# Patient Record
Sex: Male | Born: 1995 | Race: White | Hispanic: No | Marital: Single | Smoking: Former smoker
Health system: Southern US, Community
[De-identification: ages and names within clinical notes are randomized; demographics above are authoritative.]

## PROBLEM LIST (undated history)

## (undated) DIAGNOSIS — F419 Anxiety disorder, unspecified: Secondary | ICD-10-CM

## (undated) DIAGNOSIS — Z973 Presence of spectacles and contact lenses: Secondary | ICD-10-CM

## (undated) DIAGNOSIS — L723 Sebaceous cyst: Secondary | ICD-10-CM

## (undated) DIAGNOSIS — Z8719 Personal history of other diseases of the digestive system: Secondary | ICD-10-CM

## (undated) DIAGNOSIS — W3400XA Accidental discharge from unspecified firearms or gun, initial encounter: Secondary | ICD-10-CM

## (undated) HISTORY — DX: Anxiety disorder, unspecified: F41.9

## (undated) HISTORY — PX: OTHER SURGICAL HISTORY: SHX169

## (undated) HISTORY — DX: Accidental discharge from unspecified firearms or gun, initial encounter: W34.00XA

## (undated) HISTORY — PX: NO PAST SURGERIES: SHX2092

---
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
## 2016-01-03 ENCOUNTER — Ambulatory Visit: Payer: Self-pay | Admitting: General Surgery

## 2016-09-18 ENCOUNTER — Ambulatory Visit: Payer: Self-pay | Admitting: General Surgery

## 2016-09-24 ENCOUNTER — Encounter (HOSPITAL_BASED_OUTPATIENT_CLINIC_OR_DEPARTMENT_OTHER): Payer: Self-pay | Admitting: *Deleted

## 2016-09-24 NOTE — Progress Notes (Signed)
NPO AFTER MN W/ EXCEPTION CLEAR LIQUIDS UNTIL 0700.  ARRIVE AT 1130.  NEEDS HG.

## 2016-09-30 ENCOUNTER — Ambulatory Visit (HOSPITAL_BASED_OUTPATIENT_CLINIC_OR_DEPARTMENT_OTHER): Payer: Managed Care, Other (non HMO) | Admitting: Anesthesiology

## 2016-09-30 ENCOUNTER — Ambulatory Visit (HOSPITAL_BASED_OUTPATIENT_CLINIC_OR_DEPARTMENT_OTHER)
Admission: RE | Admit: 2016-09-30 | Discharge: 2016-09-30 | Disposition: A | Discharge status: Home / Self Care | Payer: Managed Care, Other (non HMO) | Source: Ambulatory Visit | Attending: General Surgery | Admitting: General Surgery

## 2016-09-30 ENCOUNTER — Encounter (HOSPITAL_BASED_OUTPATIENT_CLINIC_OR_DEPARTMENT_OTHER)
Admission: RE | Disposition: A | Discharge status: Home / Self Care | Payer: Self-pay | Source: Ambulatory Visit | Attending: General Surgery

## 2016-09-30 ENCOUNTER — Encounter (HOSPITAL_BASED_OUTPATIENT_CLINIC_OR_DEPARTMENT_OTHER): Payer: Self-pay | Admitting: Anesthesiology

## 2016-09-30 DIAGNOSIS — M25861 Other specified joint disorders, right knee: Secondary | ICD-10-CM | POA: Diagnosis not present

## 2016-09-30 DIAGNOSIS — F419 Anxiety disorder, unspecified: Secondary | ICD-10-CM | POA: Diagnosis not present

## 2016-09-30 DIAGNOSIS — F17210 Nicotine dependence, cigarettes, uncomplicated: Secondary | ICD-10-CM | POA: Insufficient documentation

## 2016-09-30 DIAGNOSIS — D487 Neoplasm of uncertain behavior of other specified sites: Secondary | ICD-10-CM | POA: Diagnosis present

## 2016-09-30 HISTORY — DX: Sebaceous cyst: L72.3

## 2016-09-30 HISTORY — DX: Presence of spectacles and contact lenses: Z97.3

## 2016-09-30 HISTORY — DX: Personal history of other diseases of the digestive system: Z87.19

## 2016-09-30 HISTORY — PX: CYST EXCISION: SHX5701

## 2016-09-30 LAB — POCT HEMOGLOBIN-HEMACUE: Hemoglobin: 15 g/dL (ref 13.0–17.0)

## 2016-09-30 SURGERY — CYST REMOVAL
Anesthesia: General | Site: Knee

## 2016-09-30 MED ORDER — CELECOXIB 200 MG PO CAPS
ORAL_CAPSULE | ORAL | Status: AC
  Filled 2016-09-30: qty 2

## 2016-09-30 MED ORDER — MIDAZOLAM HCL 2 MG/2ML IJ SOLN
INTRAMUSCULAR | Status: AC
  Filled 2016-09-30: qty 2

## 2016-09-30 MED ORDER — MIDAZOLAM HCL 2 MG/2ML IJ SOLN
0.5000 mg | Freq: Once | INTRAMUSCULAR | Status: DC | PRN
  Filled 2016-09-30: qty 2

## 2016-09-30 MED ORDER — LACTATED RINGERS IV SOLN
INTRAVENOUS | Status: DC
  Administered 2016-09-30: 12:00:00 via INTRAVENOUS
  Filled 2016-09-30: qty 1000

## 2016-09-30 MED ORDER — CHLORHEXIDINE GLUCONATE CLOTH 2 % EX PADS
6.0000 | MEDICATED_PAD | Freq: Once | CUTANEOUS | Status: DC
  Filled 2016-09-30: qty 6

## 2016-09-30 MED ORDER — LIDOCAINE HCL 1 % IJ SOLN
INTRAMUSCULAR | Status: AC
  Filled 2016-09-30: qty 20

## 2016-09-30 MED ORDER — GABAPENTIN 300 MG PO CAPS
300.0000 mg | ORAL_CAPSULE | ORAL | Status: AC
  Administered 2016-09-30: 300 mg via ORAL
  Filled 2016-09-30: qty 1

## 2016-09-30 MED ORDER — DEXAMETHASONE SODIUM PHOSPHATE 10 MG/ML IJ SOLN
INTRAMUSCULAR | Status: AC
  Filled 2016-09-30: qty 1

## 2016-09-30 MED ORDER — ONDANSETRON HCL 4 MG/2ML IJ SOLN
INTRAMUSCULAR | Status: DC | PRN
  Administered 2016-09-30: 4 mg via INTRAVENOUS

## 2016-09-30 MED ORDER — MEPERIDINE HCL 25 MG/ML IJ SOLN
6.2500 mg | INTRAMUSCULAR | Status: DC | PRN
  Filled 2016-09-30: qty 1

## 2016-09-30 MED ORDER — ACETAMINOPHEN 500 MG PO TABS
ORAL_TABLET | ORAL | Status: AC
  Filled 2016-09-30: qty 2

## 2016-09-30 MED ORDER — BACITRACIN-NEOMYCIN-POLYMYXIN 400-5-5000 EX OINT
TOPICAL_OINTMENT | CUTANEOUS | Status: AC
  Filled 2016-09-30: qty 1

## 2016-09-30 MED ORDER — KETOROLAC TROMETHAMINE 30 MG/ML IJ SOLN
INTRAMUSCULAR | Status: AC
  Filled 2016-09-30: qty 1

## 2016-09-30 MED ORDER — PROPOFOL 10 MG/ML IV BOLUS
INTRAVENOUS | Status: AC
  Filled 2016-09-30: qty 20

## 2016-09-30 MED ORDER — CELECOXIB 400 MG PO CAPS
400.0000 mg | ORAL_CAPSULE | ORAL | Status: AC
  Administered 2016-09-30: 400 mg via ORAL
  Filled 2016-09-30: qty 1

## 2016-09-30 MED ORDER — BUPIVACAINE HCL 0.5 % IJ SOLN
INTRAMUSCULAR | Status: DC | PRN
  Administered 2016-09-30: 18 mL

## 2016-09-30 MED ORDER — CEFAZOLIN SODIUM-DEXTROSE 2-4 GM/100ML-% IV SOLN
2.0000 g | INTRAVENOUS | Status: AC
  Administered 2016-09-30: 2 g via INTRAVENOUS
  Filled 2016-09-30: qty 100

## 2016-09-30 MED ORDER — MIDAZOLAM HCL 5 MG/5ML IJ SOLN
INTRAMUSCULAR | Status: DC | PRN
  Administered 2016-09-30: 2 mg via INTRAVENOUS

## 2016-09-30 MED ORDER — FENTANYL CITRATE (PF) 100 MCG/2ML IJ SOLN
INTRAMUSCULAR | Status: DC | PRN
  Administered 2016-09-30: 50 ug via INTRAVENOUS

## 2016-09-30 MED ORDER — ACETAMINOPHEN 500 MG PO TABS
1000.0000 mg | ORAL_TABLET | ORAL | Status: AC
  Administered 2016-09-30: 1000 mg via ORAL
  Filled 2016-09-30: qty 2

## 2016-09-30 MED ORDER — PROMETHAZINE HCL 25 MG/ML IJ SOLN
6.2500 mg | INTRAMUSCULAR | Status: DC | PRN
  Filled 2016-09-30: qty 1

## 2016-09-30 MED ORDER — BUPIVACAINE-EPINEPHRINE (PF) 0.5% -1:200000 IJ SOLN
INTRAMUSCULAR | Status: AC
  Filled 2016-09-30: qty 30

## 2016-09-30 MED ORDER — ONDANSETRON HCL 4 MG/2ML IJ SOLN
INTRAMUSCULAR | Status: AC
  Filled 2016-09-30: qty 2

## 2016-09-30 MED ORDER — FENTANYL CITRATE (PF) 100 MCG/2ML IJ SOLN
25.0000 ug | INTRAMUSCULAR | Status: DC | PRN
  Filled 2016-09-30: qty 1

## 2016-09-30 MED ORDER — BUPIVACAINE HCL (PF) 0.5 % IJ SOLN
INTRAMUSCULAR | Status: AC
  Filled 2016-09-30: qty 30

## 2016-09-30 MED ORDER — HYDROCODONE-ACETAMINOPHEN 5-325 MG PO TABS
1.0000 | ORAL_TABLET | Freq: Four times a day (QID) | ORAL | 0 refills | Status: DC | PRN
Start: 2016-09-30 — End: 2017-08-21

## 2016-09-30 MED ORDER — IBUPROFEN 800 MG PO TABS: 800.0000 mg | ORAL_TABLET | Freq: Three times a day (TID) | ORAL | 0 refills | Status: DC | PRN

## 2016-09-30 MED ORDER — FENTANYL CITRATE (PF) 100 MCG/2ML IJ SOLN
INTRAMUSCULAR | Status: AC
  Filled 2016-09-30: qty 2

## 2016-09-30 MED ORDER — LIDOCAINE 2% (20 MG/ML) 5 ML SYRINGE
INTRAMUSCULAR | Status: DC | PRN
  Administered 2016-09-30: 30 mg via INTRAVENOUS

## 2016-09-30 MED ORDER — PROPOFOL 10 MG/ML IV BOLUS
INTRAVENOUS | Status: DC | PRN
Start: 2016-09-30 — End: 2016-09-30
  Administered 2016-09-30: 100 mg via INTRAVENOUS
  Administered 2016-09-30: 200 mg via INTRAVENOUS
  Administered 2016-09-30: 100 mg via INTRAVENOUS
  Administered 2016-09-30: 20 mg via INTRAVENOUS

## 2016-09-30 MED ORDER — DEXAMETHASONE SODIUM PHOSPHATE 10 MG/ML IJ SOLN
INTRAMUSCULAR | Status: DC | PRN
  Administered 2016-09-30: 10 mg via INTRAVENOUS

## 2016-09-30 MED ORDER — GABAPENTIN 300 MG PO CAPS
ORAL_CAPSULE | ORAL | Status: AC
  Filled 2016-09-30: qty 1

## 2016-09-30 MED ORDER — CEFAZOLIN SODIUM-DEXTROSE 2-4 GM/100ML-% IV SOLN
INTRAVENOUS | Status: AC
  Filled 2016-09-30: qty 100

## 2016-09-30 MED ORDER — LIDOCAINE 2% (20 MG/ML) 5 ML SYRINGE
INTRAMUSCULAR | Status: AC
  Filled 2016-09-30: qty 10

## 2016-09-30 SURGICAL SUPPLY — 49 items
BLADE CLIPPER SURG (BLADE) ×2 IMPLANT
BLADE HEX COATED 2.75 (ELECTRODE) ×3 IMPLANT
BLADE SURG 15 STRL LF DISP TIS (BLADE) ×1 IMPLANT
BLADE SURG 15 STRL SS (BLADE) ×3
BNDG GAUZE ELAST 4 BULKY (GAUZE/BANDAGES/DRESSINGS) IMPLANT
CANISTER SUCT 1200ML W/VALVE (MISCELLANEOUS) IMPLANT
CHLORAPREP W/TINT 26ML (MISCELLANEOUS) ×3 IMPLANT
COVER BACK TABLE 60X90IN (DRAPES) ×3 IMPLANT
COVER MAYO STAND STRL (DRAPES) ×2 IMPLANT
DECANTER SPIKE VIAL GLASS SM (MISCELLANEOUS) ×2 IMPLANT
DRAIN PENROSE 18X1/2 LTX STRL (DRAIN) IMPLANT
DRAIN PENROSE 18X1/4 LTX STRL (WOUND CARE) IMPLANT
DRAPE LAPAROTOMY 100X72 PEDS (DRAPES) ×3 IMPLANT
DRAPE UTILITY XL STRL (DRAPES) ×3 IMPLANT
DRSG TEGADERM 4X4.75 (GAUZE/BANDAGES/DRESSINGS) IMPLANT
ELECT REM PT RETURN 9FT ADLT (ELECTROSURGICAL) ×3
ELECTRODE REM PT RTRN 9FT ADLT (ELECTROSURGICAL) ×1 IMPLANT
GLOVE BIOGEL PI IND STRL 7.0 (GLOVE) ×1 IMPLANT
GLOVE BIOGEL PI INDICATOR 7.0 (GLOVE) ×2
GLOVE SURG SS PI 7.0 STRL IVOR (GLOVE) ×3 IMPLANT
GOWN STRL REUS W/ TWL LRG LVL3 (GOWN DISPOSABLE) ×1 IMPLANT
GOWN STRL REUS W/TWL LRG LVL3 (GOWN DISPOSABLE) ×3
KIT RM TURNOVER CYSTO AR (KITS) ×3 IMPLANT
LIQUID BAND (GAUZE/BANDAGES/DRESSINGS) ×2 IMPLANT
NDL HYPO 25X1 1.5 SAFETY (NEEDLE) ×1 IMPLANT
NEEDLE HYPO 25X1 1.5 SAFETY (NEEDLE) ×3 IMPLANT
NS IRRIG 500ML POUR BTL (IV SOLUTION) IMPLANT
PACK BASIN DAY SURGERY FS (CUSTOM PROCEDURE TRAY) ×3 IMPLANT
PENCIL BUTTON HOLSTER BLD 10FT (ELECTRODE) ×3 IMPLANT
SLEEVE SCD COMPRESS KNEE MED (MISCELLANEOUS) IMPLANT
SPONGE GAUZE 4X4 12PLY STER LF (GAUZE/BANDAGES/DRESSINGS) IMPLANT
SPONGE LAP 4X18 X RAY DECT (DISPOSABLE) ×2 IMPLANT
SUCTION FRAZIER TIP 10 FR DISP (SUCTIONS) ×2 IMPLANT
SUT MNCRL AB 4-0 PS2 18 (SUTURE) ×2 IMPLANT
SUT SILK 3 0 TIES 17X18 (SUTURE)
SUT SILK 3-0 18XBRD TIE BLK (SUTURE) IMPLANT
SUT VIC AB 2-0 SH 27 (SUTURE)
SUT VIC AB 2-0 SH 27XBRD (SUTURE) IMPLANT
SUT VIC AB 3-0 SH 27 (SUTURE) ×3
SUT VIC AB 3-0 SH 27X BRD (SUTURE) IMPLANT
SWAB CULTURE ESWAB REG 1ML (MISCELLANEOUS) IMPLANT
SWAB CULTURE LIQ STUART DBL (MISCELLANEOUS) IMPLANT
SYR BULB 3OZ (MISCELLANEOUS) ×2 IMPLANT
SYR CONTROL 10ML LL (SYRINGE) ×3 IMPLANT
TOWEL OR 17X24 6PK STRL BLUE (TOWEL DISPOSABLE) ×3 IMPLANT
TUBE ANAEROBIC SPECIMEN COL (MISCELLANEOUS) IMPLANT
TUBE CONNECTING 12'X1/4 (SUCTIONS)
TUBE CONNECTING 12X1/4 (SUCTIONS) IMPLANT
YANKAUER SUCT BULB TIP NO VENT (SUCTIONS) IMPLANT

## 2016-09-30 NOTE — Anesthesia Postprocedure Evaluation (Signed)
Anesthesia Post Note  Patient: Geoffrey Conley [id number]  Procedure(s) Performed: Procedure(s) (LRB): EXCISION OF RIGHT LOWER EXTREMITY SUPERFICAL CYST (N/A)  Patient location during evaluation: PACU Anesthesia Type: General Level of consciousness: awake and alert, oriented and patient cooperative Pain management: pain level controlled Vital Signs Assessment: post-procedure vital signs reviewed and stable Respiratory status: spontaneous breathing, nonlabored ventilation and respiratory function stable Cardiovascular status: blood pressure returned to baseline and stable Postop Assessment: no signs of nausea or vomiting Anesthetic complications: no       Last Vitals:  Vitals:   09/30/16 1415 09/30/16 1430  BP: (!) 90/50 105/69  Pulse: 67 (!) 115  Resp: 18 (!) 23  Temp:      Last Pain:  Vitals:   09/30/16 1119  TempSrc: Oral                 Ginamarie Banfield,E. Angelino Rumery

## 2016-09-30 NOTE — Anesthesia Procedure Notes (Addendum)
Procedure Name: LMA Insertion Date/Time: 09/30/2016 12:55 PM Performed by: Wanita Chamberlain Pre-anesthesia Checklist: Patient identified, Timeout performed, Emergency Drugs available, Suction available and Patient being monitored Patient Re-evaluated:Patient Re-evaluated prior to inductionOxygen Delivery Method: Circle system utilized Preoxygenation: Pre-oxygenation with 100% oxygen Intubation Type: IV induction Ventilation: Mask ventilation without difficulty LMA: LMA inserted LMA Size: 4.0 Number of attempts: 2 Placement Confirmation: positive ETCO2 and breath sounds checked- equal and bilateral Tube secured with: Tape Dental Injury: Teeth and Oropharynx as per pre-operative assessment

## 2016-09-30 NOTE — Anesthesia Preprocedure Evaluation (Addendum)
Anesthesia Evaluation  Patient identified by MRN, date of birth, ID band Patient awake    Reviewed: Allergy & Precautions, NPO status , Patient's Chart, lab work & pertinent test results  History of Anesthesia Complications Negative for: history of anesthetic complications  Airway Mallampati: II  TM Distance: >3 FB Neck ROM: Full    Dental  (+) Dental Advisory Given   Pulmonary Current Smoker,    breath sounds clear to auscultation       Cardiovascular negative cardio ROS   Rhythm:Regular Rate:Normal     Neuro/Psych Anxiety negative neurological ROS     GI/Hepatic negative GI ROS, Neg liver ROS,   Endo/Other  negative endocrine ROS  Renal/GU negative Renal ROS     Musculoskeletal   Abdominal   Peds  Hematology negative hematology ROS (+)   Anesthesia Other Findings Pt anxious pre-op. Vagal response with IV insertion.  Reproductive/Obstetrics                            Anesthesia Physical Anesthesia Plan  ASA: II  Anesthesia Plan: General   Post-op Pain Management:    Induction: Intravenous  Airway Management Planned: LMA  Additional Equipment:   Intra-op Plan:   Post-operative Plan:   Informed Consent: I have reviewed the patients History and Physical, chart, labs and discussed the procedure including the risks, benefits and alternatives for the proposed anesthesia with the patient or authorized representative who has indicated his/her understanding and acceptance.   Dental advisory given  Plan Discussed with: CRNA and Surgeon  Anesthesia Plan Comments: (Plan routine monitors, GA- LMA OK)        Anesthesia Quick Evaluation

## 2016-09-30 NOTE — H&P (Signed)
Geoffrey Conley [id number] is an 21 y.o. male.   Chief Complaint: right knee cyst HPI: 21 yo male with 2 year history of right knee cyst. It has been drained multiple times and continues to recur. It is uncomfortable at all times. It has not been infected in over a year.  Past Medical History:  Diagnosis Date  . History of small bowel obstruction    07-23-2015--- resolved w/ medical management  . Sebaceous cyst    right lower extremity  . Wears contact lenses     Past Surgical History:  Procedure Laterality Date  . NO PAST SURGERIES      Family History  Problem Relation Age of Onset  . Nephrolithiasis Father   . Cholelithiasis Paternal Grandmother   . Ulcers Neg Hx   . Celiac disease Neg Hx    Social History:  reports that he has been smoking Cigarettes.  He has a 0.75 pack-year smoking history. He quit smokeless tobacco use about 3 years ago. His smokeless tobacco use included Snuff. He reports that he drinks alcohol. He reports that he does not use drugs.  Allergies: No Known Allergies  Medications Prior to Admission  Medication Sig Dispense Refill  . multivitamin (ONE-A-DAY MEN'S) TABS tablet Take 1 tablet by mouth daily.      Results for orders placed or performed during the hospital encounter of 09/30/16 (from the past 48 hour(s))  Hemoglobin-hemacue, POC     Status: None   Collection Time: 09/30/16 12:09 PM  Result Value Ref Range   Hemoglobin 15.0 13.0 - 17.0 g/dL   No results found.  Review of Systems  Constitutional: Negative for chills and fever.  HENT: Negative for hearing loss.   Eyes: Negative for blurred vision and double vision.  Respiratory: Negative for cough and hemoptysis.   Cardiovascular: Negative for chest pain and palpitations.  Gastrointestinal: Negative for abdominal pain, nausea and vomiting.  Genitourinary: Negative for dysuria and urgency.  Musculoskeletal: Negative for myalgias and neck pain.  Skin: Negative for itching and rash.  Neurological:  Negative for dizziness, tingling and headaches.  Endo/Heme/Allergies: Does not bruise/bleed easily.  Psychiatric/Behavioral: Negative for depression and suicidal ideas.    Blood pressure 109/61, pulse 67, temperature 98.2 F (36.8 C), temperature source Oral, resp. rate 16, height 5' 9.75" (1.772 m), weight 60.8 kg (134 lb), SpO2 99 %. Physical Exam  Vitals reviewed. Constitutional: He is oriented to person, place, and time. He appears well-developed and well-nourished.  HENT:  Head: Normocephalic and atraumatic.  Eyes: Conjunctivae and EOM are normal. Pupils are equal, round, and reactive to light.  Neck: Normal range of motion. Neck supple.  Cardiovascular: Normal rate and regular rhythm.   Respiratory: Effort normal and breath sounds normal.  GI: Soft. Bowel sounds are normal. He exhibits no distension. There is no tenderness.  Musculoskeletal: Normal range of motion.  Neurological: He is alert and oriented to person, place, and time.  Skin: Skin is warm and dry.  Mobile right knee cyst  Psychiatric: He has a normal mood and affect. His behavior is normal.     Assessment/Plan 21 yo male with likely sebaceous cyst of the right knee -excision as outpatient  Mickeal Skinner, MD 09/30/2016, 12:32 PM

## 2016-09-30 NOTE — Op Note (Signed)
Preoperative diagnosis: right knee cyst  Postoperative diagnosis: same   Procedure: excision of right knee cyst Surgeon: Gurney Maxin, M.D.  Asst: none  Anesthesia: gen  Indications for procedure: Geoffrey Conley [id number] is a 21 y.o. year old male with symptoms of recurrent draining right knee cyst.  Description of procedure: The patient was brought into the operative suite. Anesthesia was administered with General LMA anesthesia. WHO checklist was applied. The patient was then placed in supine position. The area was prepped and draped in the usual sterile fashion.  Next the area was infiltrated with 0.5% marcaine. After that, a elliptical incision was made around the cyst. Cautery was used to dissect through the dermal and subcutaneous tissue. Further blunt and sharp dissection was used to fully remove the cyst from surrounding tissue. The cyst was removed en bloc. Hemostasis was applied with cautery. The incision bed was irrigated with saline. Additional amount of 0.5% marcaine was infused into the deeper tissues being careful to stay free of the periosteum. Next a 3-0 vicryl was used in interrupted fashion to close the deep dermal space and a 4-0 monocryl was used in subcuticular stitch to close the skin. Dermabond was put on for dressing. The patient tolerated the procedure well and was transferred to pacu in stable condition.  Findings: 2cm cyst  Specimen: right knee cyst  Implant: none   Blood loss: <24ml  Local anesthesia: 29ml [id number] marcaine  Complications: none  Gurney Maxin, M.D. General, Bariatric, & Minimally Invasive Surgery [facility]

## 2016-09-30 NOTE — Transfer of Care (Signed)
Immediate Anesthesia Transfer of Care Note  Patient: Geoffrey Conley [id number]  Procedure(s) Performed: Procedure(s): EXCISION OF RIGHT LOWER EXTREMITY SUPERFICAL CYST (N/A)  Patient Location: PACU  Anesthesia Type:General  Level of Consciousness: awake, alert , oriented and patient cooperative  Airway & Oxygen Therapy: Patient Spontanous Breathing and Patient connected to nasal cannula oxygen  Post-op Assessment: Report given to RN and Post -op Vital signs reviewed and stable  Post vital signs: Reviewed and stable  Last Vitals:  Vitals:   09/30/16 1119  BP: 109/61  Pulse: 67  Resp: 16  Temp: 36.8 C    Last Pain:  Vitals:   09/30/16 1119  TempSrc: Oral      Patients Stated Pain Goal: 5 ([id number])  Complications: No apparent anesthesia complications

## 2016-09-30 NOTE — Discharge Instructions (Signed)

## 2016-10-01 ENCOUNTER — Encounter (HOSPITAL_BASED_OUTPATIENT_CLINIC_OR_DEPARTMENT_OTHER): Payer: Self-pay | Admitting: General Surgery

## 2017-07-04 ENCOUNTER — Encounter (HOSPITAL_COMMUNITY): Payer: Self-pay

## 2017-07-04 ENCOUNTER — Emergency Department (HOSPITAL_COMMUNITY): Payer: 59

## 2017-07-04 ENCOUNTER — Other Ambulatory Visit: Payer: Self-pay

## 2017-07-04 ENCOUNTER — Emergency Department (HOSPITAL_COMMUNITY)
Admission: EM | Admit: 2017-07-04 | Discharge: 2017-07-04 | Disposition: A | Discharge status: Home / Self Care | Payer: 59 | Attending: Emergency Medicine | Admitting: Emergency Medicine

## 2017-07-04 DIAGNOSIS — Z87891 Personal history of nicotine dependence: Secondary | ICD-10-CM | POA: Insufficient documentation

## 2017-07-04 DIAGNOSIS — Y9389 Activity, other specified: Secondary | ICD-10-CM | POA: Insufficient documentation

## 2017-07-04 DIAGNOSIS — W3409XA Accidental discharge from other specified firearms, initial encounter: Secondary | ICD-10-CM | POA: Insufficient documentation

## 2017-07-04 DIAGNOSIS — Y998 Other external cause status: Secondary | ICD-10-CM | POA: Diagnosis not present

## 2017-07-04 DIAGNOSIS — Y929 Unspecified place or not applicable: Secondary | ICD-10-CM | POA: Diagnosis not present

## 2017-07-04 DIAGNOSIS — S79922A Unspecified injury of left thigh, initial encounter: Secondary | ICD-10-CM | POA: Diagnosis present

## 2017-07-04 DIAGNOSIS — S71102A Unspecified open wound, left thigh, initial encounter: Secondary | ICD-10-CM | POA: Diagnosis not present

## 2017-07-04 DIAGNOSIS — W3400XA Accidental discharge from unspecified firearms or gun, initial encounter: Secondary | ICD-10-CM

## 2017-07-04 LAB — URINALYSIS, ROUTINE W REFLEX MICROSCOPIC
BACTERIA UA: NONE SEEN
BILIRUBIN URINE: NEGATIVE
Glucose, UA: NEGATIVE mg/dL
KETONES UR: 5 mg/dL — AB
LEUKOCYTES UA: NEGATIVE
Nitrite: NEGATIVE
PROTEIN: NEGATIVE mg/dL
Specific Gravity, Urine: 1.012 (ref 1.005–1.030)
Squamous Epithelial / LPF: NONE SEEN
pH: 6 (ref 5.0–8.0)

## 2017-07-04 LAB — COMPREHENSIVE METABOLIC PANEL
ALBUMIN: 4.9 g/dL (ref 3.5–5.0)
ALK PHOS: 74 U/L (ref 38–126)
ALT: 14 U/L — ABNORMAL LOW (ref 17–63)
ANION GAP: 14 (ref 5–15)
AST: 28 U/L (ref 15–41)
BUN: 14 mg/dL (ref 6–20)
CALCIUM: 9.9 mg/dL (ref 8.9–10.3)
CHLORIDE: 102 mmol/L (ref 101–111)
CO2: 22 mmol/L (ref 22–32)
Creatinine, Ser: 0.99 mg/dL (ref 0.61–1.24)
GFR calc Af Amer: >60 mL/min (ref >60)
GFR calc non Af Amer: >60 mL/min (ref >60)
GLUCOSE: 108 mg/dL — AB (ref 65–99)
POTASSIUM: 3.3 mmol/L — AB (ref 3.5–5.1)
SODIUM: 138 mmol/L (ref 135–145)
Total Bilirubin: 0.4 mg/dL (ref 0.3–1.2)
Total Protein: 7.3 g/dL (ref 6.5–8.1)

## 2017-07-04 LAB — I-STAT CHEM 8, ED
BUN: 15 mg/dL (ref 6–20)
CALCIUM ION: 1.14 mmol/L — AB (ref 1.15–1.40)
CHLORIDE: 102 mmol/L (ref 101–111)
Creatinine, Ser: 0.9 mg/dL (ref 0.61–1.24)
Glucose, Bld: 106 mg/dL — ABNORMAL HIGH (ref 65–99)
HCT: 47 % (ref 39.0–52.0)
HEMOGLOBIN: 16 g/dL (ref 13.0–17.0)
POTASSIUM: 3.3 mmol/L — AB (ref 3.5–5.1)
SODIUM: 139 mmol/L (ref 135–145)
TCO2: 23 mmol/L (ref 22–32)

## 2017-07-04 LAB — CBC
HEMATOCRIT: 44.2 % (ref 39.0–52.0)
HEMOGLOBIN: 15.4 g/dL (ref 13.0–17.0)
MCH: 32.8 pg (ref 26.0–34.0)
MCHC: 34.8 g/dL (ref 30.0–36.0)
MCV: 94 fL (ref 78.0–100.0)
Platelets: 282 10*3/uL (ref 150–400)
RBC: 4.7 MIL/uL (ref 4.22–5.81)
RDW: 12.5 % (ref 11.5–15.5)
WBC: 8.8 10*3/uL (ref 4.0–10.5)

## 2017-07-04 LAB — ETHANOL: Alcohol, Ethyl (B): <10 mg/dL (ref <10)

## 2017-07-04 LAB — CDS SEROLOGY

## 2017-07-04 LAB — PROTIME-INR
INR: 0.98
PROTHROMBIN TIME: 12.9 s (ref 11.4–15.2)

## 2017-07-04 LAB — I-STAT CG4 LACTIC ACID, ED: Lactic Acid, Venous: 3.93 mmol/L (ref 0.5–1.9)

## 2017-07-04 LAB — SAMPLE TO BLOOD BANK

## 2017-07-04 MED ORDER — HYDROCODONE-ACETAMINOPHEN 5-325 MG PO TABS: 1.0000 | ORAL_TABLET | ORAL | 0 refills | Status: DC | PRN

## 2017-07-04 MED ORDER — CEFAZOLIN SODIUM-DEXTROSE 1-4 GM/50ML-% IV SOLN
1.0000 g | Freq: Once | INTRAVENOUS | Status: AC
  Administered 2017-07-04: 1 g via INTRAVENOUS
  Filled 2017-07-04: qty 50

## 2017-07-04 MED ORDER — SODIUM CHLORIDE 0.9 % IV SOLN
INTRAVENOUS | Status: AC | PRN
  Administered 2017-07-04: 1000 mL via INTRAVENOUS

## 2017-07-04 MED ORDER — OXYCODONE-ACETAMINOPHEN 5-325 MG PO TABS
1.0000 | ORAL_TABLET | Freq: Once | ORAL | Status: AC
Start: 2017-07-04 — End: 2017-07-04
  Administered 2017-07-04: 1 via ORAL
  Filled 2017-07-04: qty 1

## 2017-07-04 NOTE — Discharge Instructions (Addendum)
Please return if you have fevers, swelling, increased pain, drainage from the site or if you develop numbness, weakness in your leg.

## 2017-07-04 NOTE — ED Notes (Signed)
Family at beside. Family given emotional support. 
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
## 2017-07-04 NOTE — Progress Notes (Signed)
Orthopedic Tech Progress Note Patient Details:  Geoffrey Conley [facility] [birth year] [id number]  Ortho Devices Type of Ortho Device: Crutches Ortho Device/Splint Interventions: Ordered, Adjustment   Braulio Bosch 07/04/2017, 8:36 PM
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
## 2017-07-05 ENCOUNTER — Encounter (HOSPITAL_COMMUNITY): Payer: Self-pay | Admitting: Emergency Medicine

## 2017-07-05 ENCOUNTER — Emergency Department (HOSPITAL_COMMUNITY)
Admission: EM | Admit: 2017-07-05 | Discharge: 2017-07-05 | Disposition: A | Discharge status: Home / Self Care | Payer: 59 | Attending: Emergency Medicine | Admitting: Emergency Medicine

## 2017-07-05 ENCOUNTER — Encounter (HOSPITAL_BASED_OUTPATIENT_CLINIC_OR_DEPARTMENT_OTHER): Payer: Self-pay | Admitting: General Surgery

## 2017-07-05 DIAGNOSIS — W320XXD Accidental handgun discharge, subsequent encounter: Secondary | ICD-10-CM | POA: Diagnosis not present

## 2017-07-05 DIAGNOSIS — Z23 Encounter for immunization: Secondary | ICD-10-CM | POA: Insufficient documentation

## 2017-07-05 DIAGNOSIS — Z87891 Personal history of nicotine dependence: Secondary | ICD-10-CM | POA: Diagnosis not present

## 2017-07-05 DIAGNOSIS — S71102D Unspecified open wound, left thigh, subsequent encounter: Secondary | ICD-10-CM | POA: Diagnosis not present

## 2017-07-05 DIAGNOSIS — Z5189 Encounter for other specified aftercare: Secondary | ICD-10-CM

## 2017-07-05 MED ORDER — LORAZEPAM 0.5 MG PO TABS
0.5000 mg | ORAL_TABLET | Freq: Once | ORAL | Status: AC
  Administered 2017-07-05: 0.5 mg via ORAL
  Filled 2017-07-05: qty 1

## 2017-07-05 MED ORDER — TETANUS-DIPHTH-ACELL PERTUSSIS 5-2.5-18.5 LF-MCG/0.5 IM SUSP
0.5000 mL | Freq: Once | INTRAMUSCULAR | Status: AC
  Administered 2017-07-05: 0.5 mL via INTRAMUSCULAR
  Filled 2017-07-05: qty 0.5
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
## 2017-08-21 ENCOUNTER — Encounter: Payer: Self-pay | Admitting: Family Medicine

## 2017-08-21 ENCOUNTER — Ambulatory Visit: Payer: 59 | Admitting: Family Medicine

## 2017-08-21 VITALS — BP 100/66 | HR 77 | Temp 98.4°F | Resp 12 | Ht 70.0 in | Wt 138.5 lb

## 2017-08-21 DIAGNOSIS — W3400XS Accidental discharge from unspecified firearms or gun, sequela: Secondary | ICD-10-CM | POA: Diagnosis not present

## 2017-08-21 DIAGNOSIS — Z23 Encounter for immunization: Secondary | ICD-10-CM | POA: Diagnosis not present

## 2017-08-21 DIAGNOSIS — R110 Nausea: Secondary | ICD-10-CM | POA: Diagnosis not present

## 2017-08-21 LAB — POCT GLYCOSYLATED HEMOGLOBIN (HGB A1C): Hemoglobin A1C: 4.9
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
## 2017-08-31 ENCOUNTER — Emergency Department (HOSPITAL_COMMUNITY): Payer: 59

## 2017-08-31 ENCOUNTER — Encounter (HOSPITAL_COMMUNITY): Payer: Self-pay | Admitting: *Deleted

## 2017-08-31 ENCOUNTER — Other Ambulatory Visit: Payer: Self-pay

## 2017-08-31 ENCOUNTER — Ambulatory Visit: Payer: Self-pay

## 2017-08-31 ENCOUNTER — Emergency Department (HOSPITAL_COMMUNITY)
Admission: EM | Admit: 2017-08-31 | Discharge: 2017-08-31 | Disposition: A | Discharge status: Home / Self Care | Payer: 59 | Attending: Emergency Medicine | Admitting: Emergency Medicine

## 2017-08-31 DIAGNOSIS — R0789 Other chest pain: Secondary | ICD-10-CM | POA: Insufficient documentation

## 2017-08-31 DIAGNOSIS — F1490 Cocaine use, unspecified, uncomplicated: Secondary | ICD-10-CM

## 2017-08-31 DIAGNOSIS — Z87891 Personal history of nicotine dependence: Secondary | ICD-10-CM | POA: Diagnosis not present

## 2017-08-31 DIAGNOSIS — R112 Nausea with vomiting, unspecified: Secondary | ICD-10-CM | POA: Diagnosis not present

## 2017-08-31 DIAGNOSIS — R0602 Shortness of breath: Secondary | ICD-10-CM | POA: Insufficient documentation

## 2017-08-31 DIAGNOSIS — F1410 Cocaine abuse, uncomplicated: Secondary | ICD-10-CM | POA: Insufficient documentation

## 2017-08-31 DIAGNOSIS — R079 Chest pain, unspecified: Secondary | ICD-10-CM

## 2017-08-31 LAB — CBC
HEMATOCRIT: 47.2 % (ref 39.0–52.0)
HEMOGLOBIN: 16.9 g/dL (ref 13.0–17.0)
MCH: 32.9 pg (ref 26.0–34.0)
MCHC: 35.8 g/dL (ref 30.0–36.0)
MCV: 92 fL (ref 78.0–100.0)
Platelets: 286 10*3/uL (ref 150–400)
RBC: 5.13 MIL/uL (ref 4.22–5.81)
RDW: 12.2 % (ref 11.5–15.5)
WBC: 8.1 10*3/uL (ref 4.0–10.5)

## 2017-08-31 LAB — RAPID URINE DRUG SCREEN, HOSP PERFORMED
Amphetamines: NOT DETECTED
Barbiturates: NOT DETECTED
Benzodiazepines: POSITIVE — AB
Cocaine: POSITIVE — AB
Opiates: NOT DETECTED
Tetrahydrocannabinol: POSITIVE — AB

## 2017-08-31 LAB — COMPREHENSIVE METABOLIC PANEL
ALK PHOS: 72 U/L (ref 38–126)
ALT: 15 U/L — AB (ref 17–63)
AST: 28 U/L (ref 15–41)
Albumin: 5.2 g/dL — ABNORMAL HIGH (ref 3.5–5.0)
Anion gap: 16 — ABNORMAL HIGH (ref 5–15)
BUN: 11 mg/dL (ref 6–20)
CALCIUM: 10.4 mg/dL — AB (ref 8.9–10.3)
CHLORIDE: 105 mmol/L (ref 101–111)
CO2: 19 mmol/L — AB (ref 22–32)
CREATININE: 1.03 mg/dL (ref 0.61–1.24)
GFR calc Af Amer: >60 mL/min (ref >60)
GFR calc non Af Amer: >60 mL/min (ref >60)
Glucose, Bld: 91 mg/dL (ref 65–99)
Potassium: 4.1 mmol/L (ref 3.5–5.1)
SODIUM: 140 mmol/L (ref 135–145)
Total Bilirubin: 1.7 mg/dL — ABNORMAL HIGH (ref 0.3–1.2)
Total Protein: 7.5 g/dL (ref 6.5–8.1)

## 2017-08-31 LAB — I-STAT TROPONIN, ED
TROPONIN I, POC: 0 ng/mL (ref 0.00–0.08)
Troponin i, poc: 0 ng/mL (ref 0.00–0.08)

## 2017-08-31 LAB — D-DIMER, QUANTITATIVE (NOT AT ARMC)

## 2017-08-31 LAB — URINALYSIS, ROUTINE W REFLEX MICROSCOPIC
BILIRUBIN URINE: NEGATIVE
GLUCOSE, UA: NEGATIVE mg/dL
HGB URINE DIPSTICK: NEGATIVE
KETONES UR: 80 mg/dL — AB
Leukocytes, UA: NEGATIVE
NITRITE: NEGATIVE
PROTEIN: 30 mg/dL — AB
Specific Gravity, Urine: 1.029 (ref 1.005–1.030)
Squamous Epithelial / LPF: NONE SEEN
pH: 7 (ref 5.0–8.0)

## 2017-08-31 LAB — ETHANOL

## 2017-08-31 LAB — LIPASE, BLOOD: Lipase: 22 U/L (ref 11–51)

## 2017-08-31 MED ORDER — HYDROXYZINE HCL 25 MG PO TABS
25.0000 mg | ORAL_TABLET | Freq: Once | ORAL | Status: AC
Start: 2017-08-31 — End: 2017-08-31
  Administered 2017-08-31: 25 mg via ORAL
  Filled 2017-08-31: qty 1

## 2017-08-31 MED ORDER — HYDROXYZINE HCL 25 MG PO TABS: 25.0000 mg | ORAL_TABLET | Freq: Four times a day (QID) | ORAL | 0 refills | Status: DC

## 2017-08-31 MED ORDER — ONDANSETRON HCL 4 MG/2ML IJ SOLN
4.0000 mg | Freq: Once | INTRAMUSCULAR | Status: AC
  Administered 2017-08-31: 4 mg via INTRAVENOUS
  Filled 2017-08-31: qty 2

## 2017-08-31 MED ORDER — LORAZEPAM 2 MG/ML IJ SOLN: 0.0000 mg | Freq: Four times a day (QID) | INTRAMUSCULAR | Status: DC

## 2017-08-31 MED ORDER — SODIUM CHLORIDE 0.9 % IV BOLUS (SEPSIS)
1000.0000 mL | Freq: Once | INTRAVENOUS | Status: AC
  Administered 2017-08-31: 1000 mL via INTRAVENOUS

## 2017-08-31 MED ORDER — LORAZEPAM 1 MG PO TABS: 0.0000 mg | ORAL_TABLET | Freq: Four times a day (QID) | ORAL | Status: DC

## 2017-08-31 MED ORDER — LORAZEPAM 1 MG PO TABS: 0.0000 mg | ORAL_TABLET | Freq: Two times a day (BID) | ORAL | Status: DC

## 2017-08-31 MED ORDER — VITAMIN B-1 100 MG PO TABS: 100.0000 mg | ORAL_TABLET | Freq: Every day | ORAL | Status: DC

## 2017-08-31 MED ORDER — THIAMINE HCL 100 MG/ML IJ SOLN: 100.0000 mg | Freq: Every day | INTRAMUSCULAR | Status: DC

## 2017-08-31 MED ORDER — LORAZEPAM 2 MG/ML IJ SOLN: 0.0000 mg | Freq: Two times a day (BID) | INTRAMUSCULAR | Status: DC

## 2017-08-31 NOTE — ED Notes (Signed)
Patient transported to X-ray 

## 2017-08-31 NOTE — ED Notes (Signed)
Pt reports feeling like he is going to pass out, pt anxious, pt calmed down with verbal reassurance
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
## 2017-08-31 NOTE — Telephone Encounter (Signed)
Pt. called with c/o feeling anxious, light headed and dizziness, nausea and vomiting.  Reported he took Cocaine once on Saturday night and 2 times on Sunday.  Reported last used Cocaine about 3:00 PM on Saturday.  Also stated he drank one beer last night.  Stated he vomited last night at about 5:00 PM and again early AM. Pt. stated he has stomach problems.   Reported he feels "clammy, and weird." Reported his feet and hands are clammy, and his hands are tingling.  Stated he has a lot of anxiety.  Heart rate checked with Apple watch; 114 BPM.  Unable to answer if his heart rate is irregular or not.  Denied chest pain or shortness of breath.  Reported heart rate at intervals during Triage call; 99-107 BPM.  Advised per protocol, that the pt. should go to the ER. Stated his girlfriend will drive him to the hospital.  Continued on phone with the pt., while he waited on his girlfriend to get ready to drive him; approx. 40 minutes.  Reported HR 86-88 @ 11:27 AM.  Stated he continues to feel weird, queezy, and weak.  Encouraged to go to the ER at this time.  Reported he is leaving for the ER in 5 minutes.            Reason for Disposition . Patient sounds very anxious or agitated  Answer Assessment - Initial Assessment Questions 1. DESCRIPTION: "Please describe your heart rate or heart beat that you are having" (e.g., fast/slow, regular/irregular, skipped or extra beats, "palpitations")     Stated my heart is racing; comes and goes  2. ONSET: "When did it start?" (Minutes, hours or days)      Yesterday afternoon about 4:00 PM 3. DURATION: "How long does it last" (e.g., seconds, minutes, hours)     Comes and goes; episodes of feeling anxious 4. PATTERN "Does it come and go, or has it been constant since it started?"  "Does it get worse with exertion?"   "Are you feeling it now?"    Comes and goes  5. TAP: "Using your hand, can you tap out what you are feeling on a chair or table in front of you, so that I can  hear?" (Note: not all patients can do this)       n/a 6. HEART RATE: "Can you tell me your heart rate?" "How many beats in 15 seconds?"  (Note: not all patients can do this)       Heart rate 118/ has anxiety issues.  7. RECURRENT SYMPTOM: "Have you ever had this before?" If so, ask: "When was the last time?" and "What happened that time?"      No  8. CAUSE: "What do you think is causing the palpitations?"     Did Cocaine at 11:00 AM and 3:00 PM on 08/30/17 9. CARDIAC HISTORY: "Do you have any history of heart disease?" (e.g., heart attack, angina, bypass surgery, angioplasty, arrhythmia)      Denied cardiac history 10. OTHER SYMPTOMS: "Do you have any other symptoms?" (e.g., dizziness, chest pain, sweating, difficulty breathing)      C/o dizziness and light headedness, nausea , and vomiting     11 . PREGNANCY: "Is there any chance you are pregnant?" "When was your last menstrual period?"       No  Answer Assessment - Initial Assessment Questions 1. DRUG: "What drug(s) are you using?"      cocaine 2. ROUTE: "How are you using this drug?" (e.g.,  swallow, smoke, inject, huff, snort)     Snort 3. HOW OFTEN: "How many days per week do you typically use it?"     Twice yesterday 4.  HOW MUCH: "How much do you use?"      Stated "a very little amount" 5. LAST 24 HOURS: "Have you used it in the last 24 hours?"     Two - three times  6. DRINKING PROBLEM: "Do you have or have you ever had an alcohol problem?"     Beer last night 7. SYMPTOMS: "What symptoms are you currently experiencing?" (e.g., none, headache, chest pain, abdominal pain, vomiting)    Nausea; vomited last night and once this morning; denied chest ; feels real anxious.  8. DETOX PROGRAM: "Have you ever gone through a substance abuse (detox) program?"     N/a 9. THERAPIST: "Do you have a counselor or therapist? Name?"    N/a  10. SUPPORT: "Who is with you now?" "Who do you live with?" "Do you have family or friends nearby who you  can talk to?"       Girlfriend  69. PREGNANCY: "Is there any chance you are pregnant?"       n/a  Protocols used: SUBSTANCE ABUSE AND DEPENDENCE-A-AH, HEART RATE AND HEARTBEAT QUESTIONS-A-AH

## 2017-08-31 NOTE — ED Triage Notes (Signed)
Pt in c/o tachycardia after last using cocaine last use yesterday @ 14:00, pt reports using marijuana today, pt reports n/v, A&O x4

## 2017-08-31 NOTE — Telephone Encounter (Signed)
Pt has arrived and checked in to ED.

## 2017-08-31 NOTE — Telephone Encounter (Signed)
Monitoring pt for ED arrival.

## 2017-08-31 NOTE — Discharge Instructions (Signed)
Lab results were encouraging.  No acute significant abnormalities on x-ray.  Recommend refraining from drug use of any kind.  Recommend decreasing alcohol consumption in a controlled manner.  Your primary care provider can assist with this.  Resources have been included to help with drug and alcohol counseling, if necessary. Return to the ED as needed for any worsening symptoms.  The hydroxyzine may be used as needed for anxiety.  Use caution as hydroxyzine can cause drowsiness.

## 2017-09-01 NOTE — Telephone Encounter (Signed)
Pt evaluated and treated in ED.

## 2017-09-21 ENCOUNTER — Encounter: Payer: Self-pay | Admitting: Family Medicine

## 2017-09-21 ENCOUNTER — Ambulatory Visit (INDEPENDENT_AMBULATORY_CARE_PROVIDER_SITE_OTHER): Payer: 59 | Admitting: Family Medicine

## 2017-09-21 VITALS — BP 112/66 | HR 71 | Temp 98.2°F | Resp 12 | Ht 70.0 in | Wt 136.4 lb

## 2017-09-21 DIAGNOSIS — Z0000 Encounter for general adult medical examination without abnormal findings: Secondary | ICD-10-CM | POA: Diagnosis not present

## 2017-09-21 DIAGNOSIS — F419 Anxiety disorder, unspecified: Secondary | ICD-10-CM

## 2017-09-21 DIAGNOSIS — M25562 Pain in left knee: Secondary | ICD-10-CM | POA: Diagnosis not present

## 2017-09-21 DIAGNOSIS — R002 Palpitations: Secondary | ICD-10-CM

## 2017-09-21 DIAGNOSIS — F1910 Other psychoactive substance abuse, uncomplicated: Secondary | ICD-10-CM | POA: Diagnosis not present

## 2017-09-21 DIAGNOSIS — Z131 Encounter for screening for diabetes mellitus: Secondary | ICD-10-CM

## 2017-09-21 LAB — COMPREHENSIVE METABOLIC PANEL
ALK PHOS: 64 U/L (ref 39–117)
ALT: 12 U/L (ref 0–53)
AST: 17 U/L (ref 0–37)
Albumin: 5 g/dL (ref 3.5–5.2)
BUN: 11 mg/dL (ref 6–23)
CO2: 31 mEq/L (ref 19–32)
Calcium: 10 mg/dL (ref 8.4–10.5)
Chloride: 101 mEq/L (ref 96–112)
Creatinine, Ser: 0.84 mg/dL (ref 0.40–1.50)
GFR: 122.52 mL/min (ref >60.00)
GLUCOSE: 95 mg/dL (ref 70–99)
POTASSIUM: 4.3 meq/L (ref 3.5–5.1)
Sodium: 141 mEq/L (ref 135–145)
TOTAL PROTEIN: 7.2 g/dL (ref 6.0–8.3)
Total Bilirubin: 0.8 mg/dL (ref 0.2–1.2)

## 2017-09-21 LAB — TSH: TSH: 0.93 u[IU]/mL (ref 0.35–4.50)

## 2017-09-21 LAB — VITAMIN D 25 HYDROXY (VIT D DEFICIENCY, FRACTURES): VITD: 26.22 ng/mL — ABNORMAL LOW (ref 30.00–100.00)

## 2017-09-21 MED ORDER — SERTRALINE HCL 50 MG PO TABS: 50.0000 mg | ORAL_TABLET | Freq: Every day | ORAL | 1 refills | Status: DC
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
## 2017-09-21 NOTE — Patient Instructions (Addendum)
A few things to remember from today's visit:   Routine general medical examination at a health care facility  Diabetes mellitus screening - Plan: Comprehensive metabolic panel  Heart palpitations - Plan: TSH  Hypercalcemia - Plan: VITAMIN D 25 Hydroxy (Vit-D Deficiency, Fractures)  Anxiety disorder, unspecified type - Plan: sertraline (ZOLOFT) 50 MG tablet, Ambulatory referral to Psychiatry   At least 150 minutes of moderate exercise per week, daily brisk walking for 15-30 min is a good exercise option. Healthy diet low in saturated (animal) fats and sweets and consisting of fresh fruits and vegetables, lean meats such as fish and white chicken and whole grains.  - Vaccines:  Tdap vaccine every 10 years.  Shingles vaccine recommended at age 46, could be given after 21 years of age but not sure about insurance coverage.  Pneumonia vaccines:  Prevnar 57 at 19 and Pneumovax at 44.   -Screening recommendations for low/normal risk males:  Screening for diabetes at age 77 and every 3 years. Earlier screening if cardiovascular risk factors.   Lipid screening at 35 and every 3 years. Screening starts in younger males with cardiovascular risk factors.  Colon cancer screening at age 38 and until age 42.  Prostate cancer screening: some controversy, starts usually at 63: Rectal exam and PSA.  Aortic Abdominal Aneurism once between 62 and 92 years old if ever smoker.  Also recommended:  1. Dental visit- Brush and floss your teeth twice daily; visit your dentist twice a year. 2. Eye doctor- Get an eye exam at least every 2 years. 3. Helmet use- Always wear a helmet when riding a bicycle, motorcycle, rollerblading or skateboarding. 4. Safe sex- If you may be exposed to sexually transmitted infections, use a condom. 5. Seat belts- Seat belts can save your live; always wear one. 6. Smoke/Carbon Monoxide detectors- These detectors need to be installed on the appropriate level of your  home. Replace batteries at least once a year. 7. Skin cancer- When out in the sun please cover up and use sunscreen 15 SPF or higher. 8. Violence- If anyone is threatening or hurting you, please tell your healthcare provider.  9. Drink alcohol in moderation- Limit alcohol intake to one drink or less per day. Never drink and drive.  Please be sure medication list is accurate. If a new problem present, please set up appointment sooner than planned today.

## 2017-10-13 ENCOUNTER — Ambulatory Visit (INDEPENDENT_AMBULATORY_CARE_PROVIDER_SITE_OTHER): Payer: 59 | Admitting: Licensed Clinical Social Worker

## 2017-10-13 DIAGNOSIS — F19980 Other psychoactive substance use, unspecified with psychoactive substance-induced anxiety disorder: Secondary | ICD-10-CM | POA: Diagnosis not present

## 2017-10-13 DIAGNOSIS — F1010 Alcohol abuse, uncomplicated: Secondary | ICD-10-CM | POA: Diagnosis not present

## 2017-10-13 DIAGNOSIS — F1210 Cannabis abuse, uncomplicated: Secondary | ICD-10-CM | POA: Diagnosis not present

## 2017-10-15 ENCOUNTER — Encounter (HOSPITAL_COMMUNITY): Payer: Self-pay | Admitting: Licensed Clinical Social Worker
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
## 2017-11-10 ENCOUNTER — Ambulatory Visit (HOSPITAL_COMMUNITY): Payer: Self-pay | Admitting: Licensed Clinical Social Worker

## 2017-12-26 ENCOUNTER — Other Ambulatory Visit: Payer: Self-pay | Admitting: Family Medicine

## 2017-12-26 DIAGNOSIS — F419 Anxiety disorder, unspecified: Secondary | ICD-10-CM

## 2018-01-05 ENCOUNTER — Other Ambulatory Visit: Payer: Self-pay | Admitting: *Deleted

## 2018-01-05 DIAGNOSIS — F419 Anxiety disorder, unspecified: Secondary | ICD-10-CM

## 2018-01-30 ENCOUNTER — Emergency Department (HOSPITAL_COMMUNITY)
Admission: EM | Admit: 2018-01-30 | Discharge: 2018-01-30 | Disposition: A | Discharge status: Home / Self Care | Payer: 59 | Attending: Emergency Medicine | Admitting: Emergency Medicine

## 2018-01-30 ENCOUNTER — Encounter (HOSPITAL_COMMUNITY): Payer: Self-pay

## 2018-01-30 ENCOUNTER — Other Ambulatory Visit: Payer: Self-pay

## 2018-01-30 DIAGNOSIS — Y939 Activity, unspecified: Secondary | ICD-10-CM | POA: Insufficient documentation

## 2018-01-30 DIAGNOSIS — Z87891 Personal history of nicotine dependence: Secondary | ICD-10-CM | POA: Insufficient documentation

## 2018-01-30 DIAGNOSIS — Y929 Unspecified place or not applicable: Secondary | ICD-10-CM | POA: Insufficient documentation

## 2018-01-30 DIAGNOSIS — T1502XA Foreign body in cornea, left eye, initial encounter: Secondary | ICD-10-CM | POA: Insufficient documentation

## 2018-01-30 DIAGNOSIS — H5712 Ocular pain, left eye: Secondary | ICD-10-CM

## 2018-01-30 DIAGNOSIS — Z79899 Other long term (current) drug therapy: Secondary | ICD-10-CM | POA: Insufficient documentation

## 2018-01-30 DIAGNOSIS — T1592XA Foreign body on external eye, part unspecified, left eye, initial encounter: Secondary | ICD-10-CM

## 2018-01-30 DIAGNOSIS — X58XXXA Exposure to other specified factors, initial encounter: Secondary | ICD-10-CM | POA: Diagnosis not present

## 2018-01-30 DIAGNOSIS — Y999 Unspecified external cause status: Secondary | ICD-10-CM | POA: Diagnosis not present

## 2018-01-30 MED ORDER — IBUPROFEN 400 MG PO TABS
600.0000 mg | ORAL_TABLET | Freq: Once | ORAL | Status: AC
  Administered 2018-01-30: 600 mg via ORAL
  Filled 2018-01-30: qty 1

## 2018-01-30 MED ORDER — OFLOXACIN 0.3 % OP SOLN
2.0000 [drp] | Freq: Four times a day (QID) | OPHTHALMIC | Status: DC
  Filled 2018-01-30: qty 5

## 2018-01-30 MED ORDER — FLUORESCEIN SODIUM 1 MG OP STRP
1.0000 | ORAL_STRIP | Freq: Once | OPHTHALMIC | Status: AC
  Administered 2018-01-30: 1 via OPHTHALMIC
  Filled 2018-01-30: qty 1

## 2018-01-30 MED ORDER — OFLOXACIN 0.3 % OP SOLN
2.0000 [drp] | Freq: Four times a day (QID) | OPHTHALMIC | Status: DC
  Administered 2018-01-30: 2 [drp] via OPHTHALMIC
  Filled 2018-01-30: qty 5

## 2018-01-30 MED ORDER — TETRACAINE HCL 0.5 % OP SOLN
2.0000 [drp] | Freq: Once | OPHTHALMIC | Status: AC
  Administered 2018-01-30: 2 [drp] via OPHTHALMIC
  Filled 2018-01-30: qty 4

## 2018-01-30 NOTE — ED Notes (Signed)
Per PA Benjamine Mola, hold visual acuity for now.

## 2018-01-30 NOTE — ED Notes (Signed)
Pt has right contact in.  Per pt report left eye vision is no different than usual with no contact in.

## 2018-01-30 NOTE — Discharge Instructions (Addendum)
Do not wear your contacts until you are told that you may do so.  Please use your eyedrops by putting 2 drops in your left eye 4 times a day for 5 days.  You have been given an appointment with Dr. Ellie Lunch at 8:30 AM on Monday.  Please make sure you keep this appointment and go to her office.  Do not use other eye drops.   Please take Ibuprofen (Advil, motrin) and Tylenol (acetaminophen) to relieve your pain.  You may take up to 600 MG (3 pills) of normal strength ibuprofen every 8 hours as needed.  In between doses of ibuprofen you make take tylenol, up to 1,000 mg (two extra strength pills).  Do not take more than 3,000 mg tylenol in a 24 hour period.  Please check all medication labels as many medications such as pain and cold medications may contain tylenol.  Do not drink alcohol while taking these medications.  Do not take other NSAID'S while taking ibuprofen (such as aleve or naproxen).  Please take ibuprofen with food to decrease stomach upset.
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
## 2018-01-30 NOTE — ED Triage Notes (Signed)
Pt endorses left eye pain that began 1 hour ago, pt states that he felt like he had something in his eye, took his contact out and it became worse. Pt's left pupil is larger than the right, both are reactive and round. VSS

## 2019-02-13 IMAGING — CR DG CHEST 2V
2 series · 2 of 2 positions shown · non-contrast
Comparison: Chest x-ray of May 24, 2012

CLINICAL DATA: Tachycardia, diaphoresis, episodes of vomiting since
[REDACTED]. Malaise. History of cocaine use. Former smoker.

EXAM:
CHEST  2 VIEW

[chest pa]
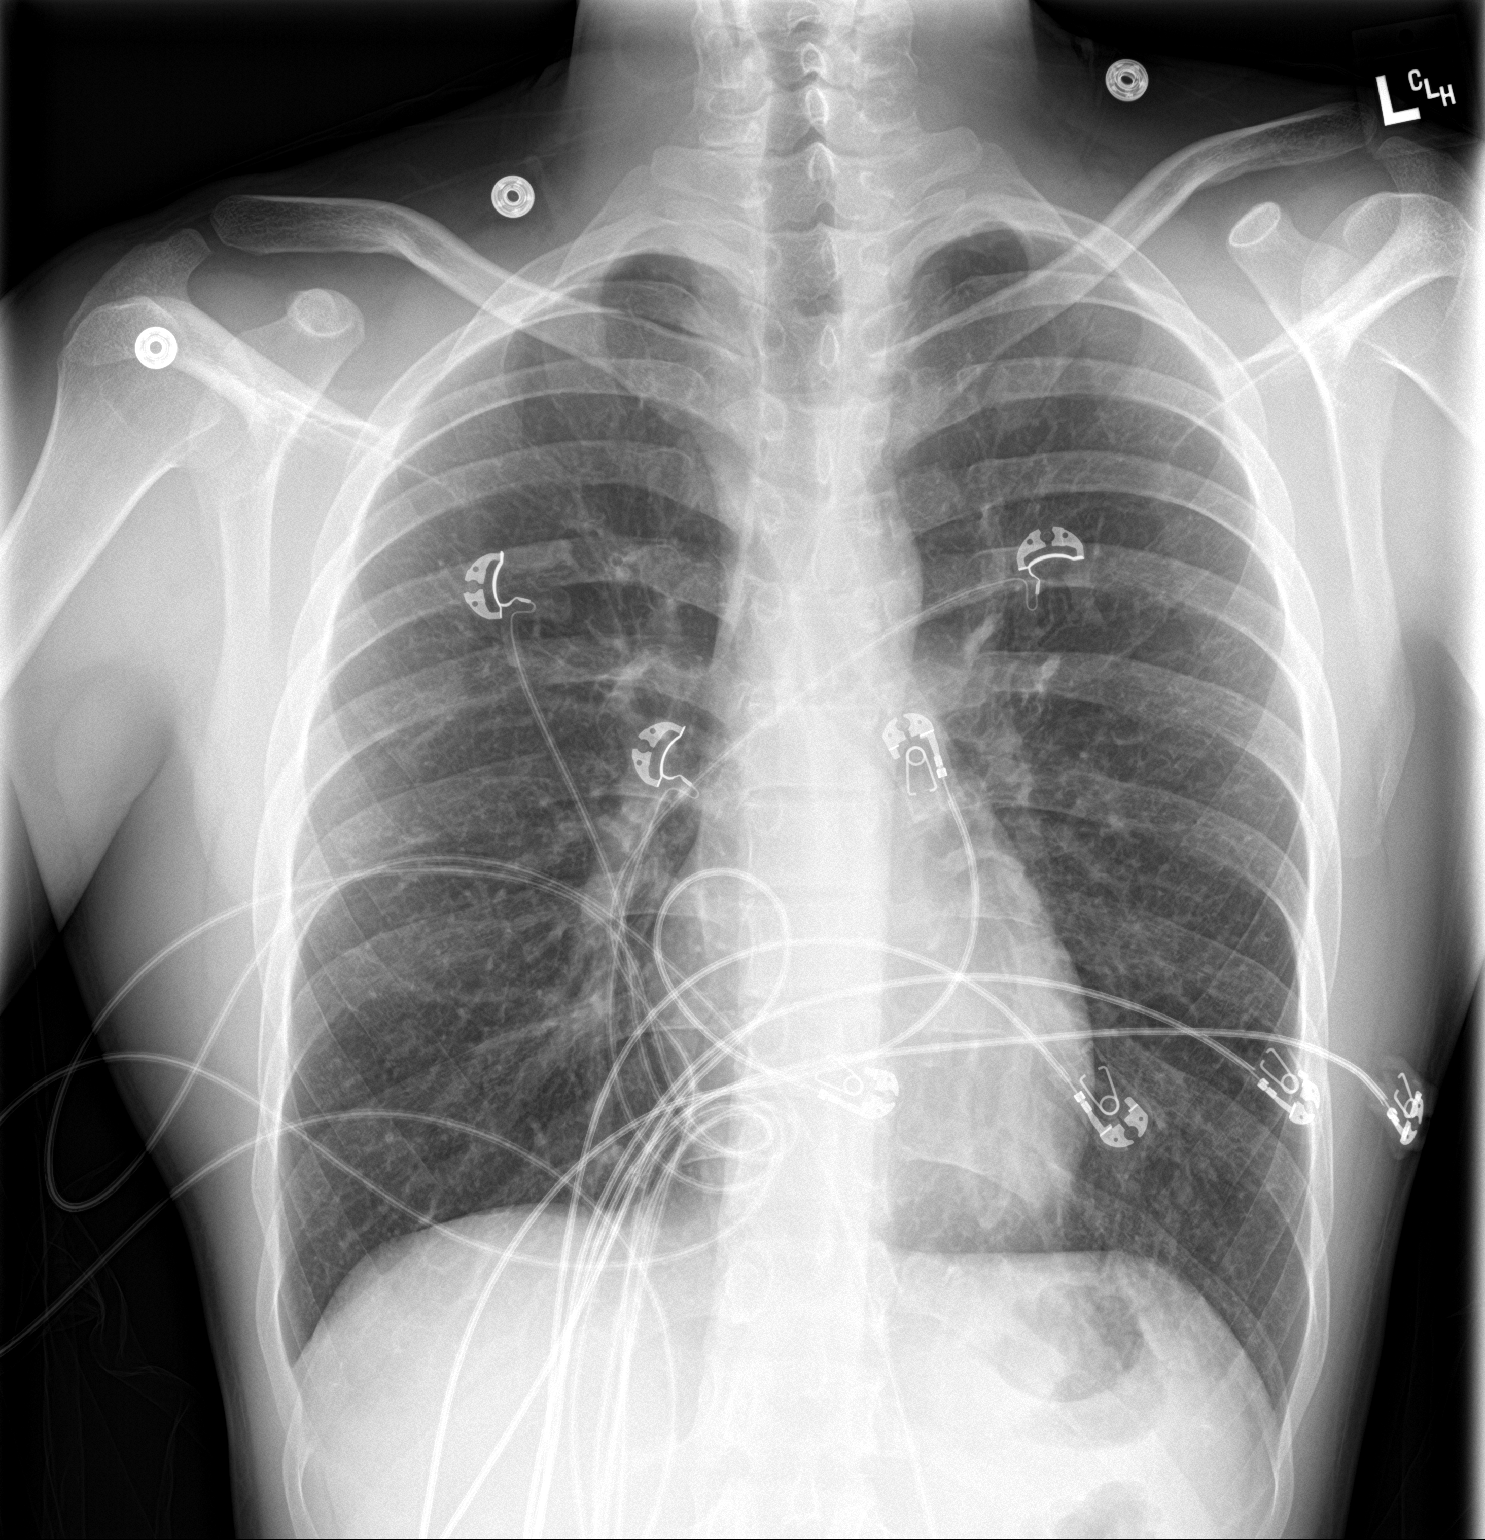

[chest lat]
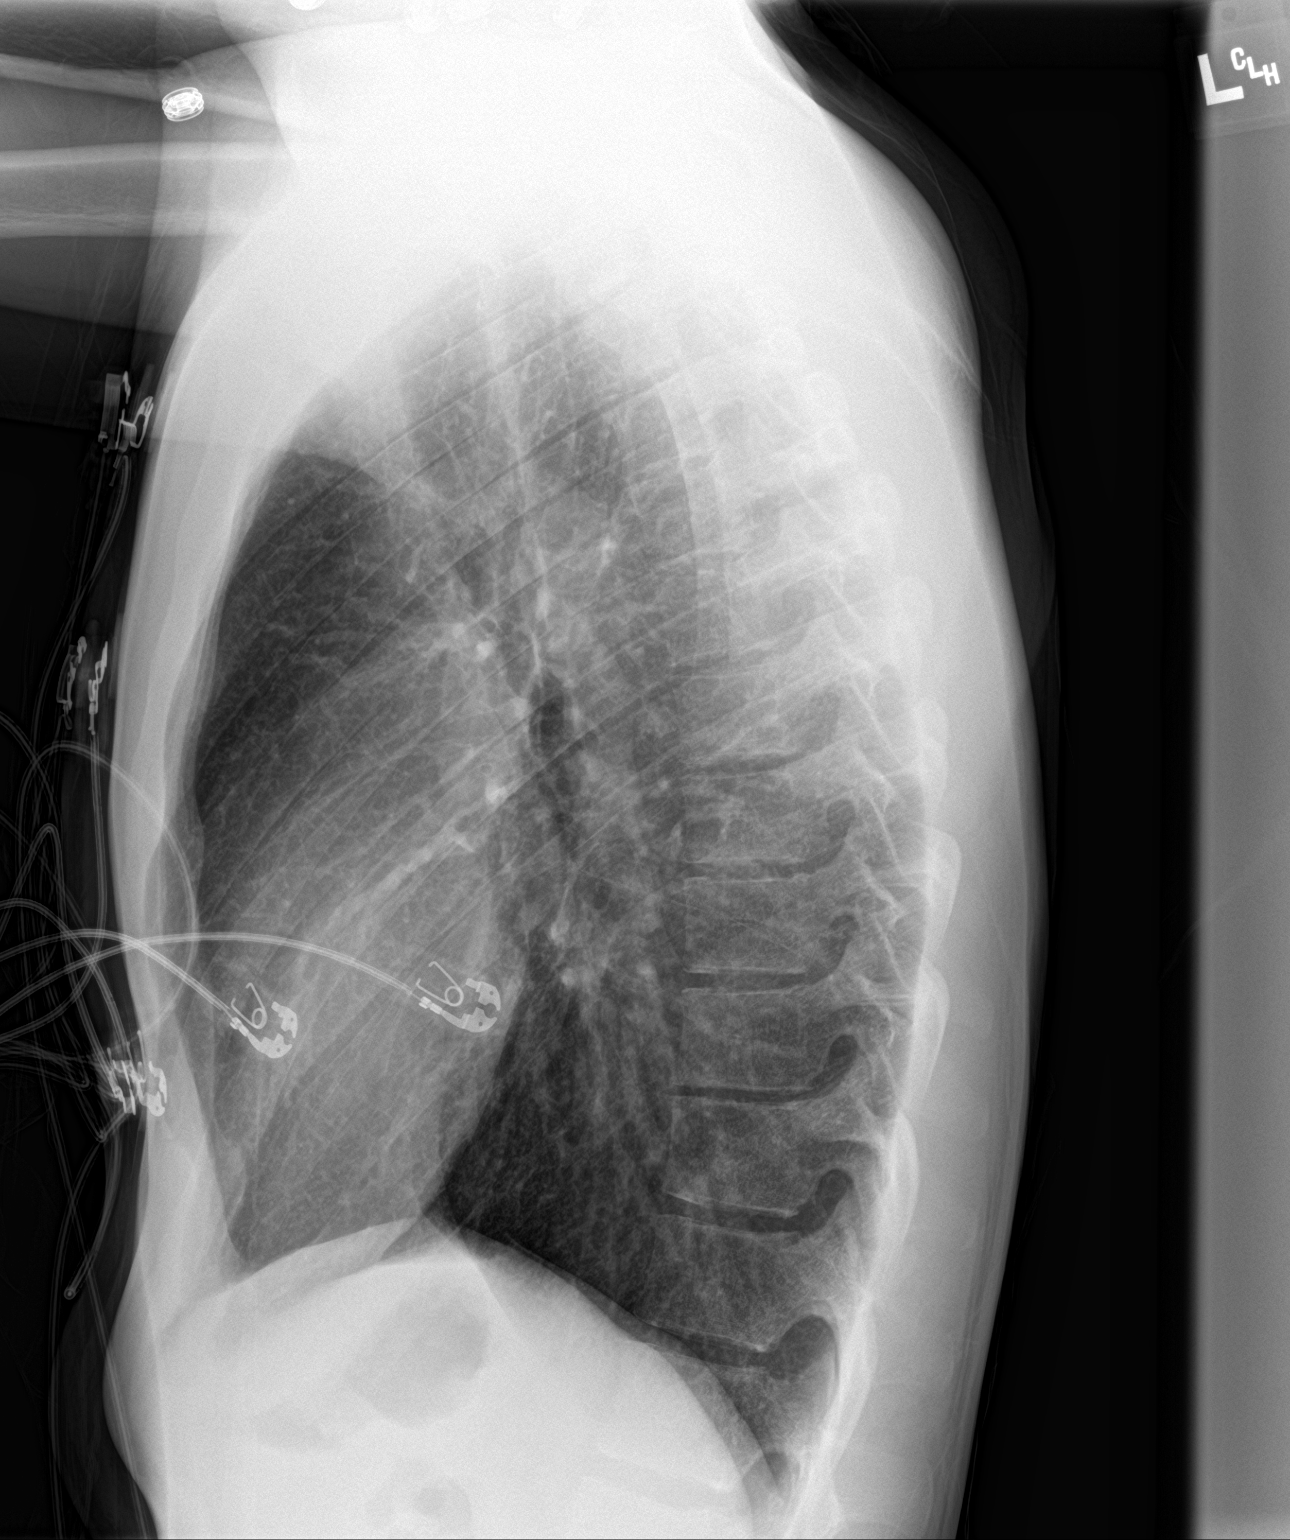

[2 of 2 positions shown; findings below may reference images not displayed]

FINDINGS: The lungs are well-expanded. There is no focal infiltrate. The
interstitial markings are mildly increased.. The heart and pulmonary
vascularity are normal. The mediastinum is normal in width. There is
no pleural effusion. The trachea is midline. The bony thorax
exhibits no acute abnormality.
IMPRESSION: Mild interstitial prominence bilaterally may reflect the patient's
smoking history as well as an element of acute bronchitis. There is
no pneumonia, CHF, nor other acute cardiopulmonary abnormality.

## 2019-03-15 ENCOUNTER — Emergency Department (HOSPITAL_COMMUNITY): Payer: 59

## 2019-03-15 ENCOUNTER — Emergency Department (HOSPITAL_COMMUNITY)
Admission: EM | Admit: 2019-03-15 | Discharge: 2019-03-16 | Disposition: A | Discharge status: Home / Self Care | Payer: 59 | Attending: Emergency Medicine | Admitting: Emergency Medicine

## 2019-03-15 ENCOUNTER — Encounter (HOSPITAL_COMMUNITY): Payer: Self-pay | Admitting: Emergency Medicine

## 2019-03-15 ENCOUNTER — Other Ambulatory Visit: Payer: Self-pay

## 2019-03-15 DIAGNOSIS — F1210 Cannabis abuse, uncomplicated: Secondary | ICD-10-CM | POA: Insufficient documentation

## 2019-03-15 DIAGNOSIS — R52 Pain, unspecified: Secondary | ICD-10-CM

## 2019-03-15 DIAGNOSIS — F1410 Cocaine abuse, uncomplicated: Secondary | ICD-10-CM | POA: Diagnosis not present

## 2019-03-15 DIAGNOSIS — Z79899 Other long term (current) drug therapy: Secondary | ICD-10-CM | POA: Insufficient documentation

## 2019-03-15 DIAGNOSIS — R197 Diarrhea, unspecified: Secondary | ICD-10-CM | POA: Insufficient documentation

## 2019-03-15 DIAGNOSIS — R1030 Lower abdominal pain, unspecified: Secondary | ICD-10-CM | POA: Diagnosis present

## 2019-03-15 DIAGNOSIS — Z87891 Personal history of nicotine dependence: Secondary | ICD-10-CM | POA: Diagnosis not present

## 2019-03-15 DIAGNOSIS — R109 Unspecified abdominal pain: Secondary | ICD-10-CM

## 2019-03-15 LAB — URINALYSIS, ROUTINE W REFLEX MICROSCOPIC
Bacteria, UA: NONE SEEN
Bilirubin Urine: NEGATIVE
Glucose, UA: NEGATIVE mg/dL
Ketones, ur: 5 mg/dL — AB
Leukocytes,Ua: NEGATIVE
Nitrite: NEGATIVE
Protein, ur: NEGATIVE mg/dL
Specific Gravity, Urine: 1.015 (ref 1.005–1.030)
pH: 6 (ref 5.0–8.0)

## 2019-03-15 LAB — CBC
HCT: 44.3 % (ref 39.0–52.0)
Hemoglobin: 15.5 g/dL (ref 13.0–17.0)
MCH: 32.6 pg (ref 26.0–34.0)
MCHC: 35 g/dL (ref 30.0–36.0)
MCV: 93.1 fL (ref 80.0–100.0)
Platelets: 260 10*3/uL (ref 150–400)
RBC: 4.76 MIL/uL (ref 4.22–5.81)
RDW: 12.3 % (ref 11.5–15.5)
WBC: 6.2 10*3/uL (ref 4.0–10.5)
nRBC: 0 % (ref 0.0–0.2)

## 2019-03-15 LAB — LIPASE, BLOOD: Lipase: 10 U/L — ABNORMAL LOW (ref 11–51)

## 2019-03-15 LAB — COMPREHENSIVE METABOLIC PANEL
ALT: 21 U/L (ref 0–44)
AST: 23 U/L (ref 15–41)
Albumin: 4.6 g/dL (ref 3.5–5.0)
Alkaline Phosphatase: 66 U/L (ref 38–126)
Anion gap: 13 (ref 5–15)
BUN: 5 mg/dL — ABNORMAL LOW (ref 6–20)
CO2: 25 mmol/L (ref 22–32)
Calcium: 9.6 mg/dL (ref 8.9–10.3)
Chloride: 101 mmol/L (ref 98–111)
Creatinine, Ser: 0.86 mg/dL (ref 0.61–1.24)
GFR calc Af Amer: >60 mL/min (ref >60)
GFR calc non Af Amer: >60 mL/min (ref >60)
Glucose, Bld: 100 mg/dL — ABNORMAL HIGH (ref 70–99)
Potassium: 3.8 mmol/L (ref 3.5–5.1)
Sodium: 139 mmol/L (ref 135–145)
Total Bilirubin: 0.9 mg/dL (ref 0.3–1.2)
Total Protein: 7.1 g/dL (ref 6.5–8.1)

## 2019-03-15 MED ORDER — SODIUM CHLORIDE 0.9 % IV BOLUS
1000.0000 mL | Freq: Once | INTRAVENOUS | Status: AC
  Administered 2019-03-15: 1000 mL via INTRAVENOUS

## 2019-03-15 MED ORDER — LACTOBACILLUS PROBIOTIC PO TABS: 1.0000 | ORAL_TABLET | Freq: Two times a day (BID) | ORAL | 0 refills | Status: AC

## 2019-03-15 MED ORDER — DICYCLOMINE HCL 20 MG PO TABS: 20.0000 mg | ORAL_TABLET | Freq: Two times a day (BID) | ORAL | 0 refills | Status: DC

## 2019-03-15 MED ORDER — SODIUM CHLORIDE 0.9% FLUSH
3.0000 mL | Freq: Once | INTRAVENOUS | Status: AC
  Administered 2019-03-15: 3 mL via INTRAVENOUS

## 2019-03-15 MED ORDER — IOHEXOL 300 MG/ML  SOLN
100.0000 mL | Freq: Once | INTRAMUSCULAR | Status: AC | PRN
  Administered 2019-03-15: 23:00:00 100 mL via INTRAVENOUS

## 2019-03-15 NOTE — ED Notes (Signed)
Patient transported to CT 
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
## 2019-03-15 NOTE — ED Triage Notes (Addendum)
Pt states he started having diarrhea last Wednesday called his pcp and was told to take imodium- pt states over the weekend he was still have diarrhea all weekend  With cramping- pt states today the cramping has been been constant and been going to bathroom constant sometimes just gas and other times loose stool. Pt states has been eating and drinking no vomiting. Pt has been on clear diet the last 5 days with no help . Hx of small bowel blockage

## 2019-03-15 NOTE — Discharge Instructions (Signed)
Diarrhea  Hand washing: Wash your hands throughout the day, but especially before and after touching the face, using the restroom, sneezing, coughing, or touching surfaces that have been coughed or sneezed upon. Hydration: Symptoms will be intensified and complicated by dehydration. Dehydration can also extend the duration of symptoms. Drink plenty of fluids and get plenty of rest. You should be drinking at least half a liter of water an hour to stay hydrated. Electrolyte drinks (ex. Gatorade, Powerade, Pedialyte) are also encouraged. You should be drinking enough fluids to make your urine light yellow, almost clear. If this is not the case, you are not drinking enough water. Please note that some of the treatments indicated below will not be effective if you are not adequately hydrated. Diet: Please concentrate on hydration, however, you may introduce food slowly.  Start with a clear liquid diet, progressed to a full liquid diet, and then bland solids as you are able. Diarrhea: May use medications such as loperamide (Imodium) or Bismuth subsalicylate (Pepto-Bismol). Bentyl: This medication is what is known as an antispasmodic and is intended to help reduce abdominal discomfort. Probiotic: Taking a lactobacillus probiotic may be helpful in recovering from diarrhea. Follow-up: Follow-up with a primary care provider or gastroenterology on this matter. Return: Return should you develop a fever, bloody diarrhea, increased abdominal pain, uncontrolled vomiting, or any other major concerns.  For prescription assistance, may try using prescription discount sites or apps, such as goodrx.com

## 2019-03-16 LAB — GASTROINTESTINAL PANEL BY PCR, STOOL (REPLACES STOOL CULTURE)

## 2019-03-16 LAB — C DIFFICILE QUICK SCREEN W PCR REFLEX
C Diff antigen: NEGATIVE
C Diff toxin: NEGATIVE

## 2019-03-16 LAB — C DIFFICILE QUICK SCREEN W PCR REFLEX??: C Diff interpretation: NOT DETECTED

## 2019-03-16 NOTE — ED Notes (Signed)
Discharge instructions discussed with pt. Pt. verbalized understanding. No questions at this time 

## 2019-03-25 ENCOUNTER — Encounter: Payer: Self-pay | Admitting: Gastroenterology

## 2019-04-06 ENCOUNTER — Ambulatory Visit: Payer: 59 | Admitting: Gastroenterology

## 2019-04-28 ENCOUNTER — Encounter: Payer: Self-pay | Admitting: Internal Medicine

## 2019-04-28 ENCOUNTER — Ambulatory Visit: Payer: 59 | Admitting: Internal Medicine

## 2019-04-28 VITALS — BP 98/58 | HR 64 | Temp 98.3°F | Ht 69.0 in | Wt 141.0 lb

## 2019-04-28 DIAGNOSIS — F411 Generalized anxiety disorder: Secondary | ICD-10-CM

## 2019-04-28 DIAGNOSIS — K589 Irritable bowel syndrome without diarrhea: Secondary | ICD-10-CM | POA: Diagnosis not present

## 2019-04-28 DIAGNOSIS — R21 Rash and other nonspecific skin eruption: Secondary | ICD-10-CM

## 2019-04-28 DIAGNOSIS — K30 Functional dyspepsia: Secondary | ICD-10-CM

## 2019-04-28 DIAGNOSIS — F410 Panic disorder [episodic paroxysmal anxiety] without agoraphobia: Secondary | ICD-10-CM

## 2019-04-28 NOTE — Patient Instructions (Signed)
Try FDgard 1 capsule by mouth before a big meal.   Follow up with Dr. Carlean Purl as needed.   Thank you for choosing me and [facility].  Gatha Mayer, M.D., [facility]
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
## 2019-04-30 ENCOUNTER — Encounter: Payer: Self-pay | Admitting: Internal Medicine

## 2019-04-30 DIAGNOSIS — F410 Panic disorder [episodic paroxysmal anxiety] without agoraphobia: Secondary | ICD-10-CM | POA: Insufficient documentation

## 2019-04-30 DIAGNOSIS — F411 Generalized anxiety disorder: Secondary | ICD-10-CM | POA: Insufficient documentation

## 2019-04-30 DIAGNOSIS — K589 Irritable bowel syndrome without diarrhea: Secondary | ICD-10-CM | POA: Insufficient documentation

## 2019-04-30 DIAGNOSIS — K30 Functional dyspepsia: Secondary | ICD-10-CM | POA: Insufficient documentation

## 2019-05-15 ENCOUNTER — Telehealth: Payer: Self-pay | Admitting: Internal Medicine

## 2019-05-15 NOTE — Telephone Encounter (Signed)
The patient called complaining of some mild pain with deep inspiration in the right upper quadrant.  No cough no fever no shortness of breath.  Is not positional.  I told him if it persisted into tomorrow, it started earlier today he does not know of any injury or anything like that, he should follow-up with primary care.  He was worried about his liver he had a CT scan in August and the liver was fine.  I explained that.  He seemed reassured.

## 2019-05-20 ENCOUNTER — Other Ambulatory Visit: Payer: Self-pay | Admitting: Nurse Practitioner

## 2019-05-20 ENCOUNTER — Other Ambulatory Visit (HOSPITAL_COMMUNITY): Payer: Self-pay | Admitting: Nurse Practitioner

## 2019-05-20 DIAGNOSIS — R1011 Right upper quadrant pain: Secondary | ICD-10-CM

## 2019-06-06 ENCOUNTER — Other Ambulatory Visit: Payer: Self-pay

## 2019-06-06 ENCOUNTER — Encounter (HOSPITAL_COMMUNITY): Payer: Self-pay

## 2019-06-06 ENCOUNTER — Encounter (HOSPITAL_COMMUNITY): Payer: 59

## 2019-06-21 ENCOUNTER — Ambulatory Visit (HOSPITAL_COMMUNITY)
Admission: RE | Admit: 2019-06-21 | Discharge: 2019-06-21 | Disposition: A | Discharge status: Home / Self Care | Payer: 59 | Source: Ambulatory Visit | Attending: Nurse Practitioner | Admitting: Nurse Practitioner

## 2019-06-21 ENCOUNTER — Other Ambulatory Visit: Payer: Self-pay

## 2019-06-21 DIAGNOSIS — R1011 Right upper quadrant pain: Secondary | ICD-10-CM | POA: Insufficient documentation

## 2019-06-21 MED ORDER — TECHNETIUM TC 99M MEBROFENIN IV KIT
5.0000 | PACK | Freq: Once | INTRAVENOUS | Status: AC | PRN
  Administered 2019-06-21: 5 via INTRAVENOUS

## 2019-07-30 ENCOUNTER — Emergency Department (HOSPITAL_COMMUNITY)
Admission: EM | Admit: 2019-07-30 | Discharge: 2019-07-30 | Disposition: A | Discharge status: Home / Self Care | Payer: Managed Care, Other (non HMO) | Attending: Emergency Medicine | Admitting: Emergency Medicine

## 2019-07-30 ENCOUNTER — Encounter (HOSPITAL_COMMUNITY): Payer: Self-pay | Admitting: Emergency Medicine

## 2019-07-30 ENCOUNTER — Other Ambulatory Visit: Payer: Self-pay

## 2019-07-30 DIAGNOSIS — Y939 Activity, unspecified: Secondary | ICD-10-CM | POA: Diagnosis not present

## 2019-07-30 DIAGNOSIS — Z87891 Personal history of nicotine dependence: Secondary | ICD-10-CM | POA: Diagnosis not present

## 2019-07-30 DIAGNOSIS — T1512XA Foreign body in conjunctival sac, left eye, initial encounter: Secondary | ICD-10-CM | POA: Diagnosis not present

## 2019-07-30 DIAGNOSIS — Y929 Unspecified place or not applicable: Secondary | ICD-10-CM | POA: Diagnosis not present

## 2019-07-30 DIAGNOSIS — X58XXXA Exposure to other specified factors, initial encounter: Secondary | ICD-10-CM | POA: Insufficient documentation

## 2019-07-30 DIAGNOSIS — Y999 Unspecified external cause status: Secondary | ICD-10-CM | POA: Insufficient documentation

## 2019-07-30 DIAGNOSIS — T1592XA Foreign body on external eye, part unspecified, left eye, initial encounter: Secondary | ICD-10-CM

## 2019-07-30 MED ORDER — FLUORESCEIN SODIUM 1 MG OP STRP
1.0000 | ORAL_STRIP | Freq: Once | OPHTHALMIC | Status: AC
  Administered 2019-07-30: 1 via OPHTHALMIC
  Filled 2019-07-30: qty 1

## 2019-07-30 MED ORDER — TETRACAINE HCL 0.5 % OP SOLN
2.0000 [drp] | Freq: Once | OPHTHALMIC | Status: AC
  Administered 2019-07-30: 12:00:00 2 [drp] via OPHTHALMIC
  Filled 2019-07-30: qty 4

## 2019-07-30 NOTE — ED Triage Notes (Signed)
Pt states he has what he believes is an eyelash in his L eye that he is unable to remove and is painful. Alert and oriented.
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
## 2019-07-30 NOTE — ED Notes (Signed)
BOTH right eye and left eye 20/25

## 2019-07-30 NOTE — Discharge Instructions (Addendum)
We have successfully removed a eyelash from your left eye, please purchase some over-the-counter drops such as Visine to help with irritation.  Please refrain from wearing your contact lenses for the next 2 days.

## 2019-07-30 NOTE — ED Notes (Signed)
An After Visit Summary was printed and given to the patient. Discharge instructions given and no further questions at this time. Pt states pain and irritation is gone now.

## 2020-02-24 ENCOUNTER — Other Ambulatory Visit: Payer: Self-pay

## 2020-02-24 ENCOUNTER — Encounter (HOSPITAL_BASED_OUTPATIENT_CLINIC_OR_DEPARTMENT_OTHER): Payer: Self-pay | Admitting: *Deleted

## 2020-02-24 ENCOUNTER — Emergency Department (HOSPITAL_BASED_OUTPATIENT_CLINIC_OR_DEPARTMENT_OTHER)
Admission: EM | Admit: 2020-02-24 | Discharge: 2020-02-24 | Disposition: A | Discharge status: Home / Self Care | Payer: 59 | Attending: Emergency Medicine | Admitting: Emergency Medicine

## 2020-02-24 DIAGNOSIS — R1031 Right lower quadrant pain: Secondary | ICD-10-CM | POA: Diagnosis not present

## 2020-02-24 DIAGNOSIS — R112 Nausea with vomiting, unspecified: Secondary | ICD-10-CM | POA: Diagnosis not present

## 2020-02-24 DIAGNOSIS — Z87891 Personal history of nicotine dependence: Secondary | ICD-10-CM | POA: Insufficient documentation

## 2020-02-24 LAB — CBC WITH DIFFERENTIAL/PLATELET
Abs Immature Granulocytes: 0.03 10*3/uL (ref 0.00–0.07)
Basophils Absolute: 0 10*3/uL (ref 0.0–0.1)
Basophils Relative: 0 %
Eosinophils Absolute: 0 10*3/uL (ref 0.0–0.5)
Eosinophils Relative: 0 %
HCT: 46.4 % (ref 39.0–52.0)
Hemoglobin: 16.3 g/dL (ref 13.0–17.0)
Immature Granulocytes: 0 %
Lymphocytes Relative: 26 %
Lymphs Abs: 1.8 10*3/uL (ref 0.7–4.0)
MCH: 33.2 pg (ref 26.0–34.0)
MCHC: 35.1 g/dL (ref 30.0–36.0)
MCV: 94.5 fL (ref 80.0–100.0)
Monocytes Absolute: 0.5 10*3/uL (ref 0.1–1.0)
Monocytes Relative: 7 %
Neutro Abs: 4.6 10*3/uL (ref 1.7–7.7)
Neutrophils Relative %: 67 %
Platelets: 257 10*3/uL (ref 150–400)
RBC: 4.91 MIL/uL (ref 4.22–5.81)
RDW: 12.3 % (ref 11.5–15.5)
WBC: 7 10*3/uL (ref 4.0–10.5)
nRBC: 0 % (ref 0.0–0.2)

## 2020-02-24 LAB — COMPREHENSIVE METABOLIC PANEL
ALT: 49 U/L — ABNORMAL HIGH (ref 0–44)
AST: 43 U/L — ABNORMAL HIGH (ref 15–41)
Albumin: 5.2 g/dL — ABNORMAL HIGH (ref 3.5–5.0)
Alkaline Phosphatase: 84 U/L (ref 38–126)
Anion gap: 12 (ref 5–15)
BUN: 10 mg/dL (ref 6–20)
CO2: 27 mmol/L (ref 22–32)
Calcium: 10 mg/dL (ref 8.9–10.3)
Chloride: 101 mmol/L (ref 98–111)
Creatinine, Ser: 0.96 mg/dL (ref 0.61–1.24)
GFR calc Af Amer: >60 mL/min (ref >60)
GFR calc non Af Amer: >60 mL/min (ref >60)
Glucose, Bld: 100 mg/dL — ABNORMAL HIGH (ref 70–99)
Potassium: 4.5 mmol/L (ref 3.5–5.1)
Sodium: 140 mmol/L (ref 135–145)
Total Bilirubin: 0.9 mg/dL (ref 0.3–1.2)
Total Protein: 8.2 g/dL — ABNORMAL HIGH (ref 6.5–8.1)

## 2020-02-24 LAB — LIPASE, BLOOD: Lipase: 24 U/L (ref 11–51)

## 2020-02-24 NOTE — Discharge Instructions (Signed)
As discussed, I was unable to rule out an appendicitis given you did not want a CT abdomen.  Return to the ER if you develop worsening right lower quadrant abdominal pain.  You may take your Zofran as needed for nausea. Continue to drink a lot of fluids. Please follow-up with PCP if symptoms not improved within the next week. Return to the ER for new or worsening pain.
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
## 2020-02-24 NOTE — ED Triage Notes (Signed)
Pt c/o right lower abd pain after violently vomiting x 1 day ago

## 2020-11-15 ENCOUNTER — Other Ambulatory Visit: Payer: Self-pay

## 2020-11-15 ENCOUNTER — Emergency Department (HOSPITAL_BASED_OUTPATIENT_CLINIC_OR_DEPARTMENT_OTHER)
Admission: EM | Admit: 2020-11-15 | Discharge: 2020-11-15 | Disposition: A | Discharge status: Home / Self Care | Payer: 59 | Attending: Emergency Medicine | Admitting: Emergency Medicine

## 2020-11-15 ENCOUNTER — Encounter (HOSPITAL_BASED_OUTPATIENT_CLINIC_OR_DEPARTMENT_OTHER): Payer: Self-pay

## 2020-11-15 DIAGNOSIS — K226 Gastro-esophageal laceration-hemorrhage syndrome: Secondary | ICD-10-CM | POA: Insufficient documentation

## 2020-11-15 DIAGNOSIS — Z87891 Personal history of nicotine dependence: Secondary | ICD-10-CM | POA: Insufficient documentation

## 2020-11-15 DIAGNOSIS — R000 Tachycardia, unspecified: Secondary | ICD-10-CM | POA: Insufficient documentation

## 2020-11-15 DIAGNOSIS — K920 Hematemesis: Secondary | ICD-10-CM | POA: Diagnosis present

## 2020-11-15 LAB — COMPREHENSIVE METABOLIC PANEL
ALT: 86 U/L — ABNORMAL HIGH (ref 0–44)
AST: 101 U/L — ABNORMAL HIGH (ref 15–41)
Albumin: 5.5 g/dL — ABNORMAL HIGH (ref 3.5–5.0)
Alkaline Phosphatase: 82 U/L (ref 38–126)
Anion gap: 17 — ABNORMAL HIGH (ref 5–15)
BUN: 10 mg/dL (ref 6–20)
CO2: 20 mmol/L — ABNORMAL LOW (ref 22–32)
Calcium: 10.7 mg/dL — ABNORMAL HIGH (ref 8.9–10.3)
Chloride: 98 mmol/L (ref 98–111)
Creatinine, Ser: 1.04 mg/dL (ref 0.61–1.24)
GFR, Estimated: >60 mL/min (ref >60)
Glucose, Bld: 93 mg/dL (ref 70–99)
Potassium: 4 mmol/L (ref 3.5–5.1)
Sodium: 135 mmol/L (ref 135–145)
Total Bilirubin: 1.1 mg/dL (ref 0.3–1.2)
Total Protein: 8.4 g/dL — ABNORMAL HIGH (ref 6.5–8.1)

## 2020-11-15 LAB — CBC WITH DIFFERENTIAL/PLATELET
Abs Immature Granulocytes: 0.02 10*3/uL (ref 0.00–0.07)
Basophils Absolute: 0 10*3/uL (ref 0.0–0.1)
Basophils Relative: 0 %
Eosinophils Absolute: 0 10*3/uL (ref 0.0–0.5)
Eosinophils Relative: 0 %
HCT: 50.3 % (ref 39.0–52.0)
Hemoglobin: 17.9 g/dL — ABNORMAL HIGH (ref 13.0–17.0)
Immature Granulocytes: 0 %
Lymphocytes Relative: 22 %
Lymphs Abs: 1.6 10*3/uL (ref 0.7–4.0)
MCH: 33.7 pg (ref 26.0–34.0)
MCHC: 35.6 g/dL (ref 30.0–36.0)
MCV: 94.7 fL (ref 80.0–100.0)
Monocytes Absolute: 0.7 10*3/uL (ref 0.1–1.0)
Monocytes Relative: 9 %
Neutro Abs: 4.9 10*3/uL (ref 1.7–7.7)
Neutrophils Relative %: 69 %
Platelets: 260 10*3/uL (ref 150–400)
RBC: 5.31 MIL/uL (ref 4.22–5.81)
RDW: 12 % (ref 11.5–15.5)
WBC: 7.3 10*3/uL (ref 4.0–10.5)
nRBC: 0 % (ref 0.0–0.2)

## 2020-11-15 LAB — LIPASE, BLOOD: Lipase: 26 U/L (ref 11–51)
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
## 2020-11-15 NOTE — ED Triage Notes (Signed)
Pt reports seen at [facility] ([place]) and sent here as his rectal exam was hem-positive. Pt needs labs for f/u

## 2020-11-15 NOTE — Discharge Instructions (Addendum)
Get Prilosec OTC and take it for the next 2 weeks.  Avoid beer, aspirin, ibuprofen, naproxen, Aleve and Goody's powders.  Avoid spicy and acidic foods for the next few weeks while you are healing.

## 2020-12-03 IMAGING — NM NM HEPATO W/GB/PHARM/[PERSON_NAME]
2 series · 12 of 12 positions shown · non-contrast
Comparison: CT 03/15/2019

CLINICAL DATA: Abdominal pain with nausea

EXAM:
NUCLEAR MEDICINE HEPATOBILIARY IMAGING WITH GALLBLADDER EF
TECHNIQUE: Sequential images of the abdomen were obtained [DATE] minutes
following intravenous administration of radiopharmaceutical. After
oral ingestion of Ensure, gallbladder ejection fraction was
determined. At 60 min, normal ejection fraction is greater than 33%.
RADIOPHARMACEUTICALS:  4.95 mCi Bc-OOm  Choletec IV

[he hepatobiliary · 4.52mm/px · 6 of 60 frames shown (1 of 2)]
[frame 6/60]
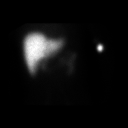
[frame 16/60]
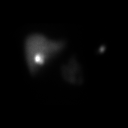
[frame 26/60]
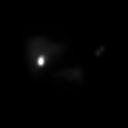
[frame 36/60]
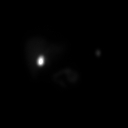
[frame 46/60]
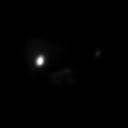
[frame 56/60]
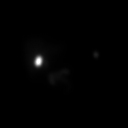

[he hepatobiliary · 4.52mm/px · 6 of 60 frames shown (2 of 2)]
[frame 6/60]
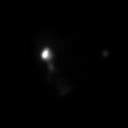
[frame 16/60]
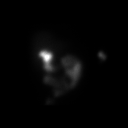
[frame 26/60]
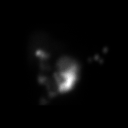
[frame 36/60]
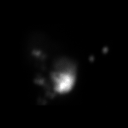
[frame 46/60]
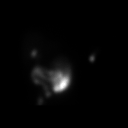
[frame 56/60]
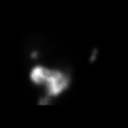

[12 of 12 positions shown; findings below may reference images not displayed]

FINDINGS: Prompt uptake and biliary excretion of activity by the liver is
seen. Gallbladder activity is visualized, consistent with patency of
cystic duct. Biliary activity passes into small bowel, consistent
with patent common bile duct.

Calculated gallbladder ejection fraction is 100%. (Normal
gallbladder ejection fraction with Ensure is greater than 33%.)
IMPRESSION: Negative examination

## 2021-02-20 ENCOUNTER — Encounter (HOSPITAL_BASED_OUTPATIENT_CLINIC_OR_DEPARTMENT_OTHER): Payer: Self-pay | Admitting: Emergency Medicine

## 2021-02-20 ENCOUNTER — Inpatient Hospital Stay (HOSPITAL_BASED_OUTPATIENT_CLINIC_OR_DEPARTMENT_OTHER)
Admission: EM | Admit: 2021-02-20 | Discharge: 2021-02-23 | DRG: 439 | Disposition: A | Discharge status: Home / Self Care | Payer: 59 | Attending: Internal Medicine | Admitting: Internal Medicine

## 2021-02-20 ENCOUNTER — Observation Stay (HOSPITAL_COMMUNITY): Payer: 59

## 2021-02-20 ENCOUNTER — Other Ambulatory Visit: Payer: Self-pay

## 2021-02-20 ENCOUNTER — Emergency Department (HOSPITAL_BASED_OUTPATIENT_CLINIC_OR_DEPARTMENT_OTHER): Payer: 59

## 2021-02-20 DIAGNOSIS — R112 Nausea with vomiting, unspecified: Secondary | ICD-10-CM | POA: Diagnosis present

## 2021-02-20 DIAGNOSIS — K852 Alcohol induced acute pancreatitis without necrosis or infection: Principal | ICD-10-CM | POA: Diagnosis present

## 2021-02-20 DIAGNOSIS — E860 Dehydration: Secondary | ICD-10-CM | POA: Diagnosis not present

## 2021-02-20 DIAGNOSIS — N179 Acute kidney failure, unspecified: Secondary | ICD-10-CM | POA: Diagnosis not present

## 2021-02-20 DIAGNOSIS — F410 Panic disorder [episodic paroxysmal anxiety] without agoraphobia: Secondary | ICD-10-CM | POA: Diagnosis present

## 2021-02-20 DIAGNOSIS — K859 Acute pancreatitis without necrosis or infection, unspecified: Secondary | ICD-10-CM | POA: Diagnosis present

## 2021-02-20 DIAGNOSIS — Z20822 Contact with and (suspected) exposure to covid-19: Secondary | ICD-10-CM | POA: Diagnosis present

## 2021-02-20 DIAGNOSIS — F411 Generalized anxiety disorder: Secondary | ICD-10-CM | POA: Diagnosis present

## 2021-02-20 DIAGNOSIS — Z8379 Family history of other diseases of the digestive system: Secondary | ICD-10-CM | POA: Diagnosis not present

## 2021-02-20 DIAGNOSIS — F1010 Alcohol abuse, uncomplicated: Secondary | ICD-10-CM | POA: Diagnosis present

## 2021-02-20 DIAGNOSIS — Z8719 Personal history of other diseases of the digestive system: Secondary | ICD-10-CM | POA: Diagnosis not present

## 2021-02-20 DIAGNOSIS — E876 Hypokalemia: Secondary | ICD-10-CM | POA: Diagnosis present

## 2021-02-20 DIAGNOSIS — Z79899 Other long term (current) drug therapy: Secondary | ICD-10-CM

## 2021-02-20 DIAGNOSIS — Z87891 Personal history of nicotine dependence: Secondary | ICD-10-CM

## 2021-02-20 LAB — RAPID URINE DRUG SCREEN, HOSP PERFORMED
Amphetamines: NOT DETECTED
Barbiturates: NOT DETECTED
Benzodiazepines: POSITIVE — AB
Cocaine: NOT DETECTED
Opiates: POSITIVE — AB
Tetrahydrocannabinol: POSITIVE — AB

## 2021-02-20 LAB — CBC
HCT: 54.7 % — ABNORMAL HIGH (ref 39.0–52.0)
Hemoglobin: 19.2 g/dL — ABNORMAL HIGH (ref 13.0–17.0)
MCH: 34 pg (ref 26.0–34.0)
MCHC: 35.1 g/dL (ref 30.0–36.0)
MCV: 96.8 fL (ref 80.0–100.0)
Platelets: 311 10*3/uL (ref 150–400)
RBC: 5.65 MIL/uL (ref 4.22–5.81)
RDW: 12.8 % (ref 11.5–15.5)
WBC: 13.1 10*3/uL — ABNORMAL HIGH (ref 4.0–10.5)
nRBC: 0 % (ref 0.0–0.2)

## 2021-02-20 LAB — COMPREHENSIVE METABOLIC PANEL
ALT: 67 U/L — ABNORMAL HIGH (ref 0–44)
AST: 67 U/L — ABNORMAL HIGH (ref 15–41)
Albumin: 6.2 g/dL — ABNORMAL HIGH (ref 3.5–5.0)
Alkaline Phosphatase: 95 U/L (ref 38–126)
Anion gap: 37 — ABNORMAL HIGH (ref 5–15)
BUN: 19 mg/dL (ref 6–20)
CO2: 12 mmol/L — ABNORMAL LOW (ref 22–32)
Calcium: 10.7 mg/dL — ABNORMAL HIGH (ref 8.9–10.3)
Chloride: 90 mmol/L — ABNORMAL LOW (ref 98–111)
Creatinine, Ser: 1.68 mg/dL — ABNORMAL HIGH (ref 0.61–1.24)
GFR, Estimated: 58 mL/min — ABNORMAL LOW (ref >60)
Glucose, Bld: 153 mg/dL — ABNORMAL HIGH (ref 70–99)
Potassium: 4.2 mmol/L (ref 3.5–5.1)
Sodium: 139 mmol/L (ref 135–145)
Total Bilirubin: 1 mg/dL (ref 0.3–1.2)
Total Protein: 9.7 g/dL — ABNORMAL HIGH (ref 6.5–8.1)

## 2021-02-20 LAB — URINALYSIS, ROUTINE W REFLEX MICROSCOPIC
Glucose, UA: NEGATIVE mg/dL
Ketones, ur: >80 mg/dL — AB
Leukocytes,Ua: NEGATIVE
Nitrite: NEGATIVE
Protein, ur: >300 mg/dL — AB
Specific Gravity, Urine: 1.041 — ABNORMAL HIGH (ref 1.005–1.030)
pH: 6 (ref 5.0–8.0)

## 2021-02-20 LAB — LACTIC ACID, PLASMA: Lactic Acid, Venous: 1.2 mmol/L (ref 0.5–1.9)

## 2021-02-20 LAB — RESP PANEL BY RT-PCR (FLU A&B, COVID) ARPGX2
Influenza A by PCR: NEGATIVE
Influenza B by PCR: NEGATIVE
SARS Coronavirus 2 by RT PCR: NEGATIVE

## 2021-02-20 LAB — HIV ANTIBODY (ROUTINE TESTING W REFLEX): HIV Screen 4th Generation wRfx: NONREACTIVE

## 2021-02-20 LAB — LIPASE, BLOOD: Lipase: 627 U/L — ABNORMAL HIGH (ref 11–51)

## 2021-02-20 MED ORDER — PHENOL 1.4 % MT LIQD
1.0000 | OROMUCOSAL | Status: DC | PRN
  Filled 2021-02-20: qty 177

## 2021-02-20 MED ORDER — MORPHINE SULFATE (PF) 2 MG/ML IV SOLN
2.0000 mg | INTRAVENOUS | Status: DC | PRN
  Administered 2021-02-20: 2 mg via INTRAVENOUS
  Filled 2021-02-20: qty 1

## 2021-02-20 MED ORDER — SODIUM CHLORIDE 0.9 % IV SOLN: INTRAVENOUS | Status: DC

## 2021-02-20 MED ORDER — ONDANSETRON HCL 4 MG/2ML IJ SOLN
4.0000 mg | Freq: Once | INTRAMUSCULAR | Status: AC
  Administered 2021-02-20: 4 mg via INTRAVENOUS
  Filled 2021-02-20: qty 2

## 2021-02-20 MED ORDER — SODIUM CHLORIDE 0.9 % IV BOLUS
1000.0000 mL | Freq: Once | INTRAVENOUS | Status: AC
  Administered 2021-02-20: 1000 mL via INTRAVENOUS

## 2021-02-20 MED ORDER — LACTATED RINGERS IV BOLUS
1000.0000 mL | Freq: Once | INTRAVENOUS | Status: AC
  Administered 2021-02-20: 1000 mL via INTRAVENOUS

## 2021-02-20 MED ORDER — FAMOTIDINE IN NACL 20-0.9 MG/50ML-% IV SOLN
20.0000 mg | Freq: Once | INTRAVENOUS | Status: AC
  Administered 2021-02-20: 20 mg via INTRAVENOUS
  Filled 2021-02-20: qty 50

## 2021-02-20 MED ORDER — IOHEXOL 300 MG/ML  SOLN
80.0000 mL | Freq: Once | INTRAMUSCULAR | Status: AC | PRN
  Administered 2021-02-20: 80 mL via INTRAVENOUS

## 2021-02-20 MED ORDER — PANTOPRAZOLE SODIUM 40 MG IV SOLR
40.0000 mg | Freq: Two times a day (BID) | INTRAVENOUS | Status: DC
  Administered 2021-02-20 – 2021-02-23 (×6): 40 mg via INTRAVENOUS
  Filled 2021-02-20 (×5): qty 40

## 2021-02-20 MED ORDER — SODIUM CHLORIDE 0.9 % IV SOLN: 1.0000 mg/h | INTRAVENOUS | Status: DC | PRN

## 2021-02-20 MED ORDER — TRAZODONE HCL 50 MG PO TABS
50.0000 mg | ORAL_TABLET | Freq: Every evening | ORAL | Status: DC | PRN
  Administered 2021-02-20: 50 mg via ORAL
  Filled 2021-02-20: qty 1

## 2021-02-20 MED ORDER — MORPHINE SULFATE (PF) 4 MG/ML IV SOLN
4.0000 mg | Freq: Once | INTRAVENOUS | Status: AC
  Administered 2021-02-20: 4 mg via INTRAVENOUS
  Filled 2021-02-20: qty 1

## 2021-02-20 MED ORDER — LIDOCAINE VISCOUS HCL 2 % MT SOLN
15.0000 mL | Freq: Once | OROMUCOSAL | Status: AC
  Administered 2021-02-20: 15 mL via ORAL
  Filled 2021-02-20: qty 15

## 2021-02-20 MED ORDER — ONDANSETRON HCL 4 MG/2ML IJ SOLN
4.0000 mg | Freq: Four times a day (QID) | INTRAMUSCULAR | Status: DC | PRN
  Administered 2021-02-20 – 2021-02-22 (×8): 4 mg via INTRAVENOUS
  Filled 2021-02-20 (×9): qty 2

## 2021-02-20 MED ORDER — SODIUM CHLORIDE 0.9 % IV BOLUS: 1000.0000 mL | Freq: Once | INTRAVENOUS | Status: DC

## 2021-02-20 MED ORDER — ONDANSETRON HCL 4 MG/2ML IJ SOLN: 4.0000 mg | Freq: Four times a day (QID) | INTRAMUSCULAR | Status: DC | PRN

## 2021-02-20 MED ORDER — HYDROMORPHONE HCL 1 MG/ML IJ SOLN
1.0000 mg | INTRAMUSCULAR | Status: DC | PRN
  Administered 2021-02-20 – 2021-02-21 (×5): 1 mg via INTRAVENOUS
  Filled 2021-02-20 (×6): qty 1

## 2021-02-20 MED ORDER — ENOXAPARIN SODIUM 40 MG/0.4ML IJ SOSY
40.0000 mg | PREFILLED_SYRINGE | INTRAMUSCULAR | Status: DC
  Administered 2021-02-20 – 2021-02-22 (×3): 40 mg via SUBCUTANEOUS
  Filled 2021-02-20 (×3): qty 0.4

## 2021-02-20 MED ORDER — ALUM & MAG HYDROXIDE-SIMETH 200-200-20 MG/5ML PO SUSP
30.0000 mL | Freq: Once | ORAL | Status: AC
  Administered 2021-02-20: 30 mL via ORAL
  Filled 2021-02-20: qty 30

## 2021-02-20 MED ORDER — MORPHINE SULFATE (PF) 2 MG/ML IV SOLN
0.5000 mg | INTRAVENOUS | Status: DC | PRN
  Filled 2021-02-20: qty 1

## 2021-02-20 NOTE — ED Notes (Signed)
Report given to [facility]. 

## 2021-02-20 NOTE — ED Notes (Signed)
Patient transported to CT 

## 2021-02-20 NOTE — Progress Notes (Signed)
Called by Dr. Melina Copa to admit the patient with acute pancreatitis and abdominal pain.  Being resuscitated in the emergency room. Given severity of symptoms, suggest admission to hospital, continue IV fluids and pain medications.  Will need further investigations about cause for pancreatitis.  I have discussed case with ER physician. Patient will be taken care of by ER physician and ER team.  We will take over care of the patient once he arrives to hospital.  No charge visit.
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
## 2021-02-20 NOTE — ED Notes (Signed)
Pt stated that he drank 3 to 4  Mixed drinks a day
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
## 2021-02-20 NOTE — ED Triage Notes (Signed)
Pt reports emesis for the past 20 hours accompanied by generalized abdominal pain.

## 2021-02-20 NOTE — Progress Notes (Signed)
Pt wanted something for his throat pain. Offered the chloreseptic, discussed his allergy with him and he said was just a minor rash that went away after showering. He said he may try it but I left it up to him to decide.

## 2021-02-21 DIAGNOSIS — N179 Acute kidney failure, unspecified: Secondary | ICD-10-CM | POA: Diagnosis present

## 2021-02-21 DIAGNOSIS — K859 Acute pancreatitis without necrosis or infection, unspecified: Secondary | ICD-10-CM | POA: Diagnosis not present

## 2021-02-21 DIAGNOSIS — Z720 Tobacco use: Secondary | ICD-10-CM | POA: Diagnosis not present

## 2021-02-21 DIAGNOSIS — Z8719 Personal history of other diseases of the digestive system: Secondary | ICD-10-CM | POA: Diagnosis not present

## 2021-02-21 DIAGNOSIS — E876 Hypokalemia: Secondary | ICD-10-CM | POA: Diagnosis present

## 2021-02-21 DIAGNOSIS — Z8379 Family history of other diseases of the digestive system: Secondary | ICD-10-CM | POA: Diagnosis not present

## 2021-02-21 DIAGNOSIS — F411 Generalized anxiety disorder: Secondary | ICD-10-CM | POA: Diagnosis present

## 2021-02-21 DIAGNOSIS — K852 Alcohol induced acute pancreatitis without necrosis or infection: Secondary | ICD-10-CM | POA: Diagnosis present

## 2021-02-21 DIAGNOSIS — E860 Dehydration: Secondary | ICD-10-CM | POA: Diagnosis present

## 2021-02-21 DIAGNOSIS — F1010 Alcohol abuse, uncomplicated: Secondary | ICD-10-CM | POA: Diagnosis present

## 2021-02-21 DIAGNOSIS — R112 Nausea with vomiting, unspecified: Secondary | ICD-10-CM | POA: Diagnosis present

## 2021-02-21 DIAGNOSIS — Z87891 Personal history of nicotine dependence: Secondary | ICD-10-CM | POA: Diagnosis not present

## 2021-02-21 DIAGNOSIS — Z79899 Other long term (current) drug therapy: Secondary | ICD-10-CM | POA: Diagnosis not present

## 2021-02-21 DIAGNOSIS — F410 Panic disorder [episodic paroxysmal anxiety] without agoraphobia: Secondary | ICD-10-CM | POA: Diagnosis present

## 2021-02-21 DIAGNOSIS — Z20822 Contact with and (suspected) exposure to covid-19: Secondary | ICD-10-CM | POA: Diagnosis present

## 2021-02-21 LAB — CBC
HCT: 44.8 % (ref 39.0–52.0)
Hemoglobin: 15.5 g/dL (ref 13.0–17.0)
MCH: 34.6 pg — ABNORMAL HIGH (ref 26.0–34.0)
MCHC: 34.6 g/dL (ref 30.0–36.0)
MCV: 100 fL (ref 80.0–100.0)
Platelets: 222 10*3/uL (ref 150–400)
RBC: 4.48 MIL/uL (ref 4.22–5.81)
RDW: 13.4 % (ref 11.5–15.5)
WBC: 10.9 10*3/uL — ABNORMAL HIGH (ref 4.0–10.5)
nRBC: 0 % (ref 0.0–0.2)

## 2021-02-21 LAB — LIPID PANEL
Cholesterol: 223 mg/dL — ABNORMAL HIGH (ref 0–200)
HDL: 49 mg/dL (ref >40)
LDL Cholesterol: 140 mg/dL — ABNORMAL HIGH (ref 0–99)
Total CHOL/HDL Ratio: 4.6 RATIO
Triglycerides: 172 mg/dL — ABNORMAL HIGH (ref <150)
VLDL: 34 mg/dL (ref 0–40)

## 2021-02-21 LAB — COMPREHENSIVE METABOLIC PANEL
ALT: 44 U/L (ref 0–44)
AST: 40 U/L (ref 15–41)
Albumin: 4.6 g/dL (ref 3.5–5.0)
Alkaline Phosphatase: 67 U/L (ref 38–126)
Anion gap: 11 (ref 5–15)
BUN: 9 mg/dL (ref 6–20)
CO2: 22 mmol/L (ref 22–32)
Calcium: 9.1 mg/dL (ref 8.9–10.3)
Chloride: 105 mmol/L (ref 98–111)
Creatinine, Ser: 0.85 mg/dL (ref 0.61–1.24)
GFR, Estimated: >60 mL/min (ref >60)
Glucose, Bld: 112 mg/dL — ABNORMAL HIGH (ref 70–99)
Potassium: 3.7 mmol/L (ref 3.5–5.1)
Sodium: 138 mmol/L (ref 135–145)
Total Bilirubin: 1.3 mg/dL — ABNORMAL HIGH (ref 0.3–1.2)
Total Protein: 7.3 g/dL (ref 6.5–8.1)

## 2021-02-21 LAB — LIPASE, BLOOD: Lipase: 1072 U/L — ABNORMAL HIGH (ref 11–51)

## 2021-02-21 MED ORDER — HYDROMORPHONE HCL 1 MG/ML IJ SOLN
1.5000 mg | INTRAMUSCULAR | Status: DC | PRN
  Administered 2021-02-21 – 2021-02-23 (×9): 1.5 mg via INTRAVENOUS
  Filled 2021-02-21 (×10): qty 1.5
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
## 2021-02-22 LAB — COMPREHENSIVE METABOLIC PANEL
ALT: 33 U/L (ref 0–44)
AST: 34 U/L (ref 15–41)
Albumin: 3.9 g/dL (ref 3.5–5.0)
Alkaline Phosphatase: 54 U/L (ref 38–126)
Anion gap: 12 (ref 5–15)
BUN: 6 mg/dL (ref 6–20)
CO2: 21 mmol/L — ABNORMAL LOW (ref 22–32)
Calcium: 8.7 mg/dL — ABNORMAL LOW (ref 8.9–10.3)
Chloride: 105 mmol/L (ref 98–111)
Creatinine, Ser: 0.61 mg/dL (ref 0.61–1.24)
GFR, Estimated: >60 mL/min (ref >60)
Glucose, Bld: 101 mg/dL — ABNORMAL HIGH (ref 70–99)
Potassium: 3.8 mmol/L (ref 3.5–5.1)
Sodium: 138 mmol/L (ref 135–145)
Total Bilirubin: 1.1 mg/dL (ref 0.3–1.2)
Total Protein: 6.3 g/dL — ABNORMAL LOW (ref 6.5–8.1)

## 2021-02-22 LAB — LIPASE, BLOOD
Lipase: 764 U/L — ABNORMAL HIGH (ref 11–51)
Lipase: 577 U/L — ABNORMAL HIGH (ref 11–51)

## 2021-02-22 LAB — CBC
HCT: 44.1 % (ref 39.0–52.0)
Hemoglobin: 15.2 g/dL (ref 13.0–17.0)
MCH: 34.8 pg — ABNORMAL HIGH (ref 26.0–34.0)
MCHC: 34.5 g/dL (ref 30.0–36.0)
MCV: 100.9 fL — ABNORMAL HIGH (ref 80.0–100.0)
Platelets: 191 10*3/uL (ref 150–400)
RBC: 4.37 MIL/uL (ref 4.22–5.81)
RDW: 13.3 % (ref 11.5–15.5)
WBC: 10.8 10*3/uL — ABNORMAL HIGH (ref 4.0–10.5)
nRBC: 0 % (ref 0.0–0.2)
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
## 2021-02-23 ENCOUNTER — Emergency Department (HOSPITAL_COMMUNITY): Payer: 59

## 2021-02-23 ENCOUNTER — Other Ambulatory Visit: Payer: Self-pay

## 2021-02-23 ENCOUNTER — Encounter (HOSPITAL_COMMUNITY): Payer: Self-pay

## 2021-02-23 ENCOUNTER — Inpatient Hospital Stay (HOSPITAL_COMMUNITY)
Admission: EM | Admit: 2021-02-23 | Discharge: 2021-02-24 | Disposition: A | Discharge status: Home / Self Care | Payer: 59 | Source: Home / Self Care | Attending: Student | Admitting: Student

## 2021-02-23 DIAGNOSIS — Z79899 Other long term (current) drug therapy: Secondary | ICD-10-CM

## 2021-02-23 DIAGNOSIS — F17210 Nicotine dependence, cigarettes, uncomplicated: Secondary | ICD-10-CM | POA: Diagnosis present

## 2021-02-23 DIAGNOSIS — K859 Acute pancreatitis without necrosis or infection, unspecified: Secondary | ICD-10-CM | POA: Diagnosis present

## 2021-02-23 DIAGNOSIS — Z720 Tobacco use: Secondary | ICD-10-CM | POA: Diagnosis present

## 2021-02-23 DIAGNOSIS — E876 Hypokalemia: Secondary | ICD-10-CM | POA: Diagnosis present

## 2021-02-23 DIAGNOSIS — F411 Generalized anxiety disorder: Secondary | ICD-10-CM | POA: Diagnosis present

## 2021-02-23 DIAGNOSIS — F410 Panic disorder [episodic paroxysmal anxiety] without agoraphobia: Secondary | ICD-10-CM | POA: Diagnosis present

## 2021-02-23 DIAGNOSIS — Z20822 Contact with and (suspected) exposure to covid-19: Secondary | ICD-10-CM | POA: Diagnosis present

## 2021-02-23 DIAGNOSIS — K852 Alcohol induced acute pancreatitis without necrosis or infection: Secondary | ICD-10-CM | POA: Diagnosis present

## 2021-02-23 DIAGNOSIS — Z888 Allergy status to other drugs, medicaments and biological substances status: Secondary | ICD-10-CM

## 2021-02-23 DIAGNOSIS — Z79891 Long term (current) use of opiate analgesic: Secondary | ICD-10-CM

## 2021-02-23 DIAGNOSIS — F1010 Alcohol abuse, uncomplicated: Secondary | ICD-10-CM | POA: Diagnosis present

## 2021-02-23 DIAGNOSIS — R1013 Epigastric pain: Secondary | ICD-10-CM

## 2021-02-23 LAB — URINALYSIS, ROUTINE W REFLEX MICROSCOPIC
Bacteria, UA: NONE SEEN
Bilirubin Urine: NEGATIVE
Glucose, UA: NEGATIVE mg/dL
Ketones, ur: 80 mg/dL — AB
Leukocytes,Ua: NEGATIVE
Nitrite: NEGATIVE
Protein, ur: 30 mg/dL — AB
Specific Gravity, Urine: 1.01 (ref 1.005–1.030)
pH: 7 (ref 5.0–8.0)

## 2021-02-23 LAB — COMPREHENSIVE METABOLIC PANEL
ALT: 48 U/L — ABNORMAL HIGH (ref 0–44)
AST: 70 U/L — ABNORMAL HIGH (ref 15–41)
Albumin: 3.6 g/dL (ref 3.5–5.0)
Alkaline Phosphatase: 77 U/L (ref 38–126)
Anion gap: 12 (ref 5–15)
BUN: <5 mg/dL — ABNORMAL LOW (ref 6–20)
CO2: 27 mmol/L (ref 22–32)
Calcium: 8.9 mg/dL (ref 8.9–10.3)
Chloride: 97 mmol/L — ABNORMAL LOW (ref 98–111)
Creatinine, Ser: 0.55 mg/dL — ABNORMAL LOW (ref 0.61–1.24)
GFR, Estimated: >60 mL/min (ref >60)
Glucose, Bld: 96 mg/dL (ref 70–99)
Potassium: 2.9 mmol/L — ABNORMAL LOW (ref 3.5–5.1)
Sodium: 136 mmol/L (ref 135–145)
Total Bilirubin: 2 mg/dL — ABNORMAL HIGH (ref 0.3–1.2)
Total Protein: 6.7 g/dL (ref 6.5–8.1)

## 2021-02-23 LAB — CBC
HCT: 38.9 % — ABNORMAL LOW (ref 39.0–52.0)
Hemoglobin: 13.8 g/dL (ref 13.0–17.0)
MCH: 34.3 pg — ABNORMAL HIGH (ref 26.0–34.0)
MCHC: 35.5 g/dL (ref 30.0–36.0)
MCV: 96.8 fL (ref 80.0–100.0)
Platelets: 206 10*3/uL (ref 150–400)
RBC: 4.02 MIL/uL — ABNORMAL LOW (ref 4.22–5.81)
RDW: 12.7 % (ref 11.5–15.5)
WBC: 11.8 10*3/uL — ABNORMAL HIGH (ref 4.0–10.5)
nRBC: 0 % (ref 0.0–0.2)

## 2021-02-23 LAB — LIPASE, BLOOD
Lipase: 140 U/L — ABNORMAL HIGH (ref 11–51)
Lipase: 72 U/L — ABNORMAL HIGH (ref 11–51)

## 2021-02-23 MED ORDER — IOHEXOL 350 MG/ML SOLN
80.0000 mL | Freq: Once | INTRAVENOUS | Status: AC | PRN
  Administered 2021-02-24: 80 mL via INTRAVENOUS

## 2021-02-23 MED ORDER — TRAMADOL HCL 50 MG PO TABS
50.0000 mg | ORAL_TABLET | Freq: Four times a day (QID) | ORAL | Status: DC | PRN
  Administered 2021-02-23 (×2): 50 mg via ORAL
  Filled 2021-02-23 (×2): qty 1

## 2021-02-23 MED ORDER — GABAPENTIN 100 MG PO CAPS
100.0000 mg | ORAL_CAPSULE | Freq: Every day | ORAL | Status: DC
  Administered 2021-02-23: 100 mg via ORAL
  Filled 2021-02-23: qty 1

## 2021-02-23 MED ORDER — TRAMADOL HCL 50 MG PO TABS: 50.0000 mg | ORAL_TABLET | Freq: Four times a day (QID) | ORAL | 0 refills | Status: DC | PRN

## 2021-02-23 MED ORDER — ONDANSETRON 4 MG PO TBDP: 4.0000 mg | ORAL_TABLET | Freq: Three times a day (TID) | ORAL | 0 refills | Status: AC | PRN

## 2021-02-23 MED ORDER — GABAPENTIN 100 MG PO CAPS: 100.0000 mg | ORAL_CAPSULE | Freq: Every day | ORAL | 0 refills | Status: AC

## 2021-02-23 MED ORDER — HYDROMORPHONE HCL 1 MG/ML IJ SOLN
0.5000 mg | INTRAMUSCULAR | Status: DC | PRN
Start: 2021-02-23 — End: 2021-02-23
  Administered 2021-02-23: 0.5 mg via INTRAVENOUS
  Filled 2021-02-23 (×2): qty 0.5
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
## 2021-02-23 NOTE — ED Triage Notes (Signed)
Pt reports being discharged today after being admitted for pancreatitis. He reports increased abdominal pain, fever, chills, and feeling overloaded with fluid. Pt reports he has not eaten anything in 3-4 days and has gained about 15 pounds.

## 2021-02-24 ENCOUNTER — Inpatient Hospital Stay (HOSPITAL_COMMUNITY): Payer: 59

## 2021-02-24 DIAGNOSIS — F411 Generalized anxiety disorder: Secondary | ICD-10-CM

## 2021-02-24 DIAGNOSIS — F410 Panic disorder [episodic paroxysmal anxiety] without agoraphobia: Secondary | ICD-10-CM

## 2021-02-24 DIAGNOSIS — F1010 Alcohol abuse, uncomplicated: Secondary | ICD-10-CM

## 2021-02-24 DIAGNOSIS — E876 Hypokalemia: Secondary | ICD-10-CM

## 2021-02-24 DIAGNOSIS — R748 Abnormal levels of other serum enzymes: Secondary | ICD-10-CM

## 2021-02-24 DIAGNOSIS — Z720 Tobacco use: Secondary | ICD-10-CM

## 2021-02-24 DIAGNOSIS — K852 Alcohol induced acute pancreatitis without necrosis or infection: Principal | ICD-10-CM

## 2021-02-24 LAB — CBC
HCT: 42.1 % (ref 39.0–52.0)
Hemoglobin: 14.8 g/dL (ref 13.0–17.0)
MCH: 34.1 pg — ABNORMAL HIGH (ref 26.0–34.0)
MCHC: 35.2 g/dL (ref 30.0–36.0)
MCV: 97 fL (ref 80.0–100.0)
Platelets: 179 10*3/uL (ref 150–400)
RBC: 4.34 MIL/uL (ref 4.22–5.81)
RDW: 12.5 % (ref 11.5–15.5)
WBC: 9.8 10*3/uL (ref 4.0–10.5)
nRBC: 0 % (ref 0.0–0.2)

## 2021-02-24 LAB — COMPREHENSIVE METABOLIC PANEL
ALT: 49 U/L — ABNORMAL HIGH (ref 0–44)
AST: 69 U/L — ABNORMAL HIGH (ref 15–41)
Albumin: 3.6 g/dL (ref 3.5–5.0)
Alkaline Phosphatase: 79 U/L (ref 38–126)
Anion gap: 12 (ref 5–15)
BUN: <5 mg/dL — ABNORMAL LOW (ref 6–20)
CO2: 25 mmol/L (ref 22–32)
Calcium: 8.8 mg/dL — ABNORMAL LOW (ref 8.9–10.3)
Chloride: 98 mmol/L (ref 98–111)
Creatinine, Ser: 0.46 mg/dL — ABNORMAL LOW (ref 0.61–1.24)
GFR, Estimated: >60 mL/min (ref >60)
Glucose, Bld: 122 mg/dL — ABNORMAL HIGH (ref 70–99)
Potassium: 3.1 mmol/L — ABNORMAL LOW (ref 3.5–5.1)
Sodium: 135 mmol/L (ref 135–145)
Total Bilirubin: 2 mg/dL — ABNORMAL HIGH (ref 0.3–1.2)
Total Protein: 6.5 g/dL (ref 6.5–8.1)

## 2021-02-24 LAB — RESP PANEL BY RT-PCR (FLU A&B, COVID) ARPGX2
Influenza A by PCR: NEGATIVE
Influenza B by PCR: NEGATIVE
SARS Coronavirus 2 by RT PCR: NEGATIVE

## 2021-02-24 LAB — LIPASE, BLOOD: Lipase: 96 U/L — ABNORMAL HIGH (ref 11–51)

## 2021-02-24 MED ORDER — POTASSIUM CHLORIDE CRYS ER 20 MEQ PO TBCR
40.0000 meq | EXTENDED_RELEASE_TABLET | ORAL | Status: DC
  Administered 2021-02-24: 40 meq via ORAL
  Filled 2021-02-24: qty 2

## 2021-02-24 MED ORDER — ONDANSETRON HCL 4 MG PO TABS: 4.0000 mg | ORAL_TABLET | Freq: Four times a day (QID) | ORAL | Status: DC | PRN

## 2021-02-24 MED ORDER — KETOROLAC TROMETHAMINE 30 MG/ML IJ SOLN
30.0000 mg | Freq: Four times a day (QID) | INTRAMUSCULAR | Status: DC
  Administered 2021-02-24 (×3): 30 mg via INTRAVENOUS
  Filled 2021-02-24 (×3): qty 1

## 2021-02-24 MED ORDER — ENOXAPARIN SODIUM 40 MG/0.4ML IJ SOSY: 40.0000 mg | PREFILLED_SYRINGE | INTRAMUSCULAR | Status: DC

## 2021-02-24 MED ORDER — KCL-LACTATED RINGERS-D5W 20 MEQ/L IV SOLN
INTRAVENOUS | Status: DC
  Filled 2021-02-24 (×2): qty 1000

## 2021-02-24 MED ORDER — ONDANSETRON HCL 4 MG/2ML IJ SOLN
4.0000 mg | Freq: Four times a day (QID) | INTRAMUSCULAR | Status: DC | PRN
  Administered 2021-02-24: 4 mg via INTRAVENOUS
  Filled 2021-02-24: qty 2

## 2021-02-24 MED ORDER — NICOTINE 21 MG/24HR TD PT24: 21.0000 mg | MEDICATED_PATCH | Freq: Every day | TRANSDERMAL | Status: DC
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
## 2021-02-24 NOTE — Discharge Instructions (Addendum)
What is Pancreatitis? Your pancreas helps you regulate the way that your body processes sugar. It also serves an important function in releasing enzymes and helping you digest food.  When your pancreas becomes swollen or inflamed, it cannot perform its function. This condition is called pancreatitis.  Because the pancreas is so closely tied to your digestive process, it's affected by what you choose to eat. In cases of acute pancreatitis, pancreas inflammation is often triggered by gallstones.  But in cases of chronic pancreatitis, in which flare-ups recur over time, your diet might have a lot to do with the problem. Researchers are finding out more about foods you can eat to protect and even help to heal your pancreas.  What to eat if you have pancreatitis To get your pancreas healthy, focus on foods that are rich in protein, low in animal fats, and contain antioxidants. Try lean meats, beans and lentils, clear soups, and dairy alternatives (such as flax milk and almond milk). Your pancreas won't have to work as hard to process these.  Research suggests that some people with pancreatitis can tolerate up to 30 to 40% of calories from fat when it's from whole-food plant sources or medium-chain triglycerides (MCTs). Others do better with much lower fat intake, such as 50 grams or less per day.  Spinach, blueberries, cherries, and whole grains can work to protect your digestion and fight the free radicals that damage your organs.  If you're craving something sweet, reach for fruit instead of added sugars since those with pancreatitis are at high risk for diabetes.  Consider cherry tomatoes, cucumbers and hummus, and fruit as your go-to snacks. Your pancreas will thank you.   What not to eat if you have pancreatitis red meat organ meats fried foods fries and potato chips mayonnaise margarine and butter full-fat dairy pastries and desserts with added sugars beverages with added sugars If  you're trying to combat pancreatitis, avoid trans-fatty acids in your diet.  Fried or heavily processed foods, like french fries and fast-food hamburgers, are some of the worst offenders. Organ meats, full-fat dairy, potato chips, and mayonnaise also top the list of foods to limit.  Cooked or deep-fried foods might trigger a flare-up of pancreatitis. You'll also want to cut back on the refined flour found in cakes, pastries, and cookies. These foods can tax the digestive system by causing your insulin levels to spike.  Pancreatitis recovery diet If you're recovering from acute or chronic pancreatitis, avoid drinking alcohol. If you smoke, you'll also need to quit. Focus on eating a low-fat diet that won't tax or inflame your pancreas.  You should also stay hydrated. Keep an electrolyte beverage or a bottle of water with you at all times.  If you've been hospitalized due to a pancreatitis flare-up, your doctor will probably refer you to a dietitian to help you learn how to change your eating habits permanently.  People with chronic pancreatitis often experience malnutrition due to their decreased pancreas function. Vitamins A, D, E, and K are most commonly found to be lacking as a result of pancreatitis.  Diet tips Always check with your doctor or dietician before changing your eating habits when you have pancreatitis. Here are some tips they might suggest:  Eat between six and eight small meals throughout the day to help recover from pancreatitis. This is easier on your digestive system than eating two or three large meals. Use MCTs as your primary fat since this type of fat does not require pancreatic  enzymes to be digested. MCTs can be found in coconut oil and palm kernel oil and is available at most health food stores. Avoid eating too much fiber at once, as this can slow digestion and result in less-than-ideal absorption of nutrients from food. Fiber may also make your limited amount of  enzymes less effective. Take a multivitamin supplement to ensure that you're getting the nutrition you need. You can find a great selection of multivitamins here.

## 2021-02-24 NOTE — Progress Notes (Signed)
Discharge instructions discussed with patient, verbalized agreement and understanding 

## 2022-08-05 IMAGING — US US ABDOMEN LIMITED
1 series · 15 of 25 positions shown · non-contrast
Comparison: Same day CT.

CLINICAL DATA: Acute pancreatitis

EXAM:
ULTRASOUND ABDOMEN LIMITED RIGHT UPPER QUADRANT

[Series 1: us abdomen limited ruq mc & wl · 15 of 43 slices shown]
[im 1/43]
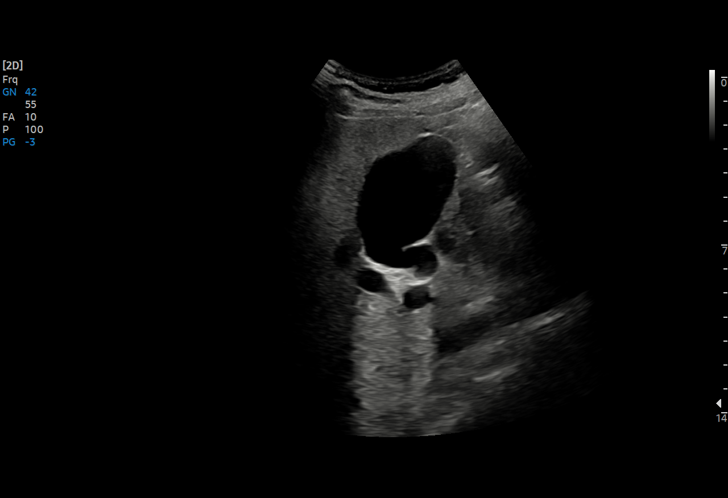
[im 4/43]
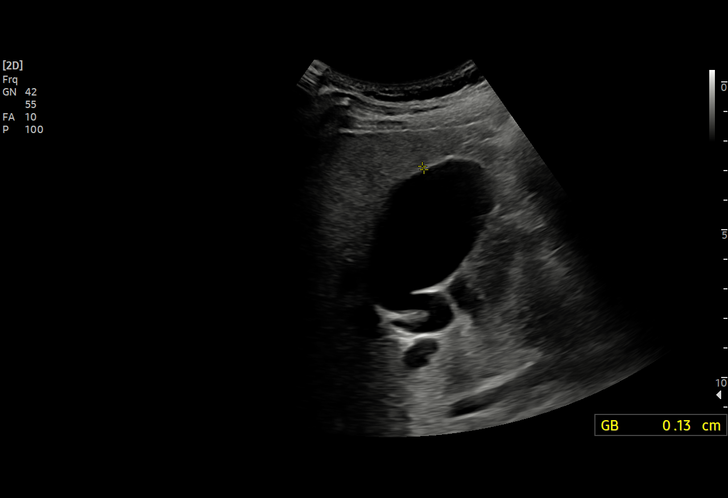
[im 8/43]
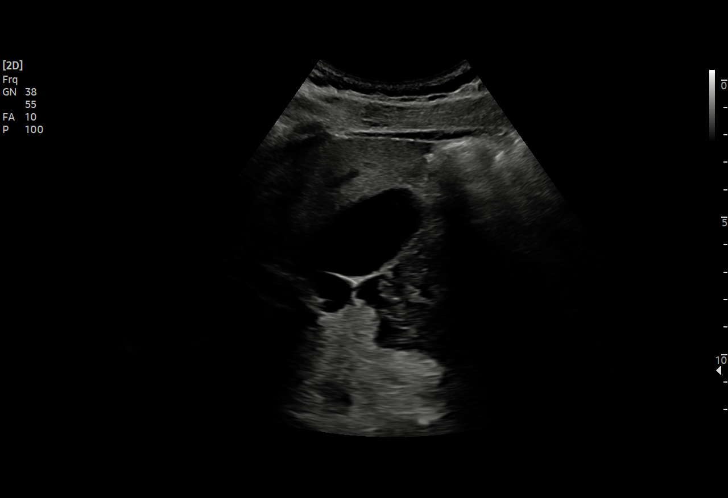
[im 9/43]
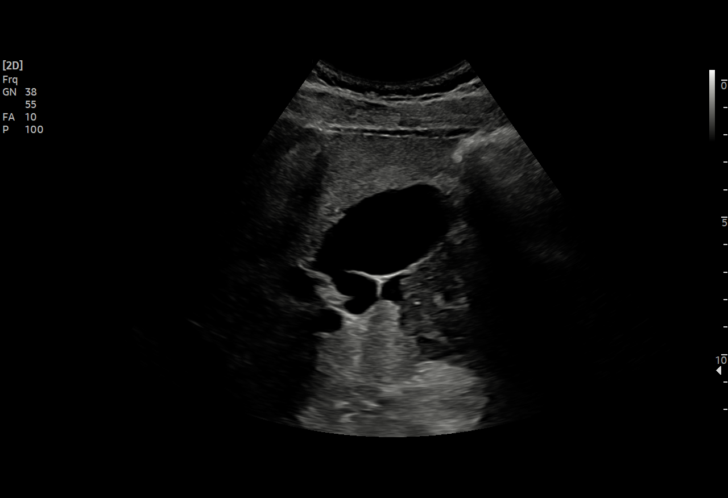
[im 13/43]
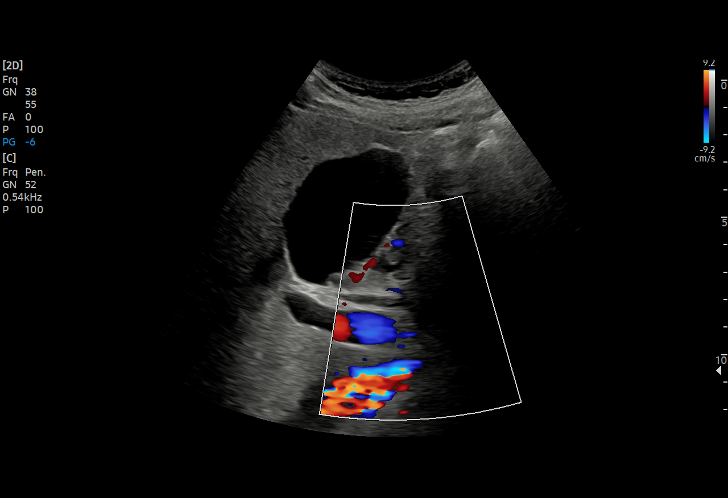
[im 16/43]
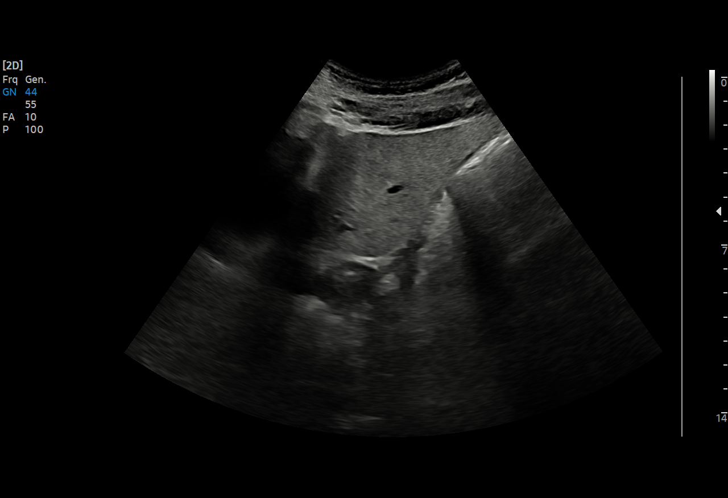
[im 18/43]
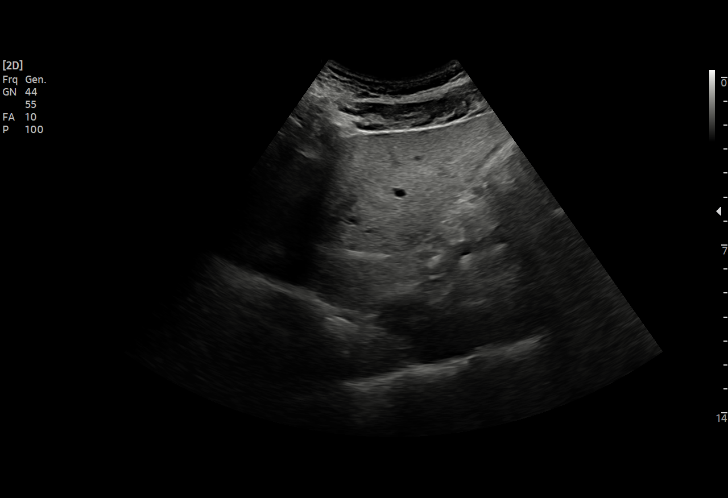
[im 22/43]
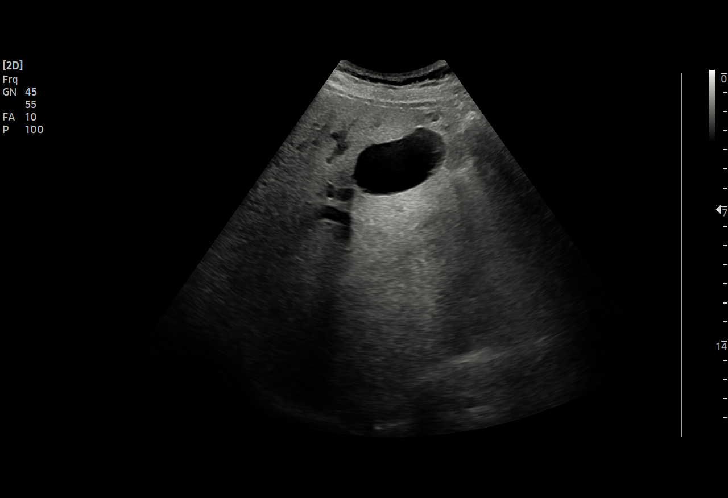
[im 25/43]
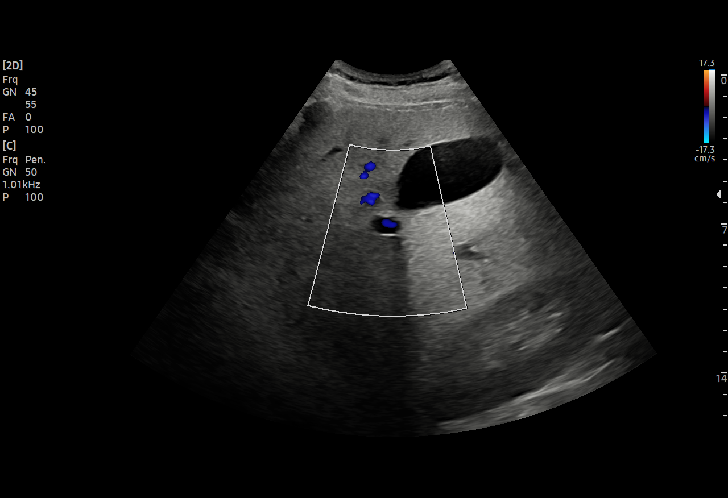
[im 27/43]
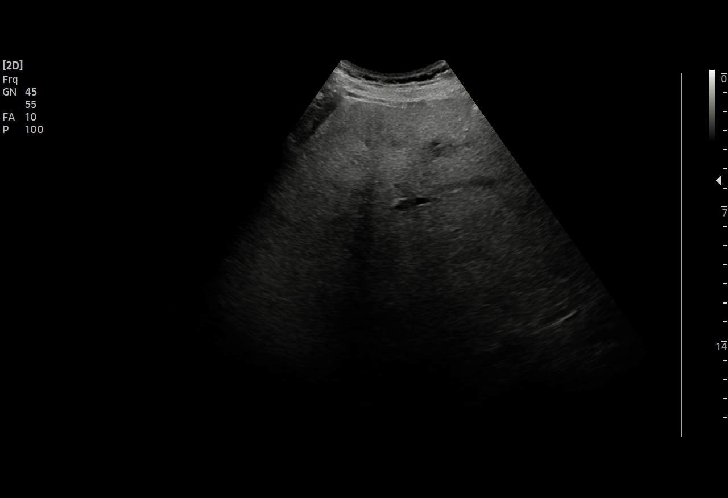
[im 30/43]
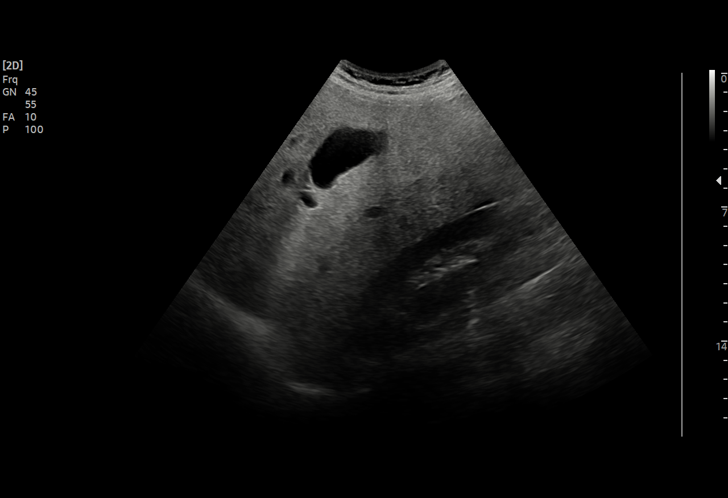
[im 34/43]
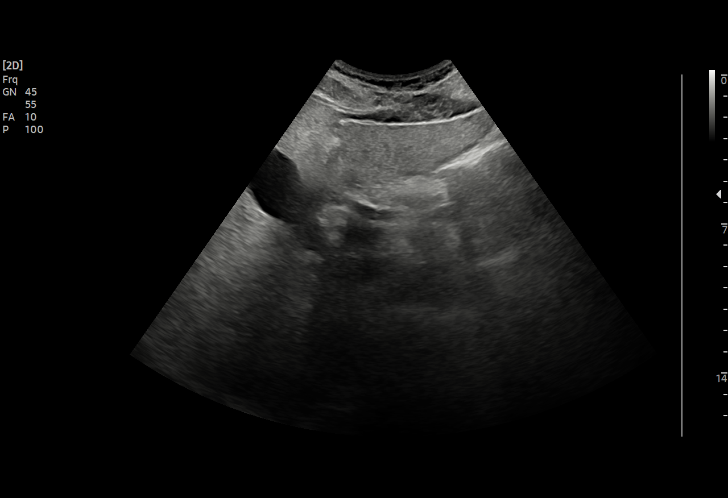
[im 36/43]
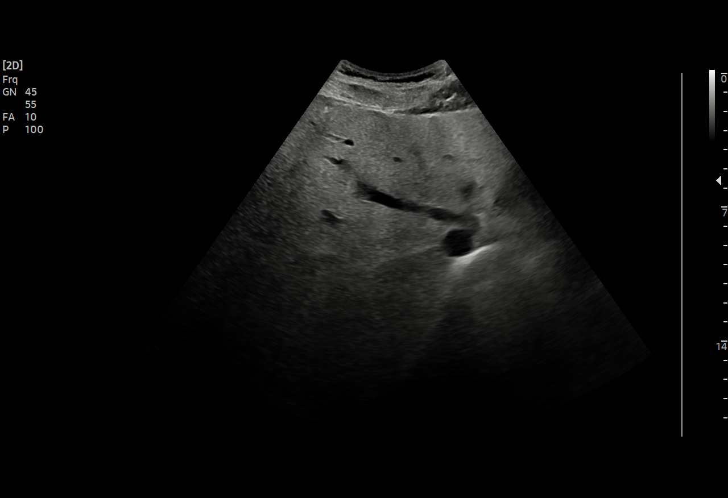
[im 39/43]
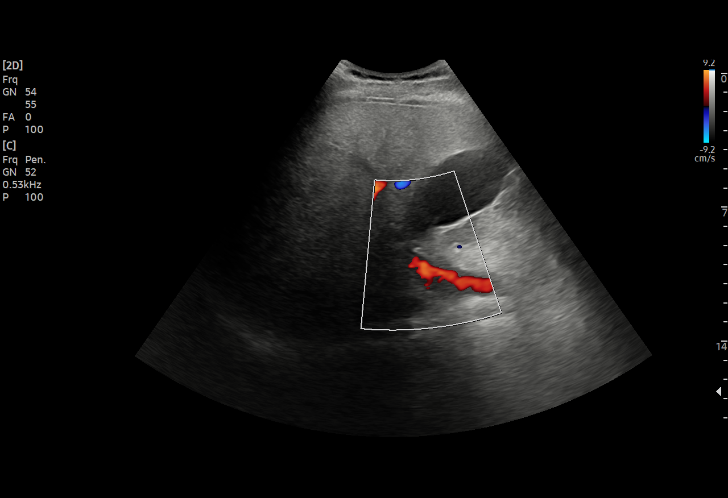
[im 43/43]
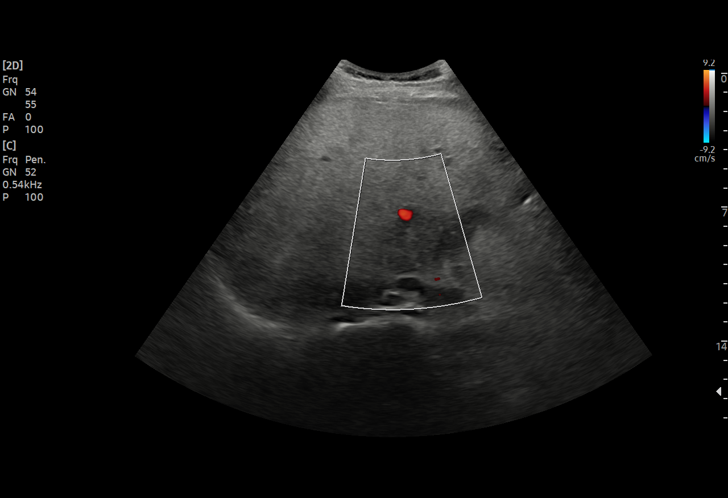

[15 of 25 positions shown; findings below may reference images not displayed]

FINDINGS: Gallbladder:

No gallstones or wall thickening visualized. No sonographic Murphy
sign noted by sonographer.

Common bile duct:

Diameter: 3 mm

Liver:

No focal lesion identified. Diffusely increased parenchymal
echogenicity. Portal vein is patent on color Doppler imaging with
normal direction of blood flow towards the liver.

Other: None.
IMPRESSION: 1. Normal sonographic appearance of the gallbladder.
2. The echogenicity of the liver is increased. This is a nonspecific
finding but is most commonly seen with fatty infiltration of the
liver. There are no obvious focal liver lesions.

## 2022-11-27 DIAGNOSIS — H578A2 Foreign body sensation, left eye: Secondary | ICD-10-CM | POA: Diagnosis not present

## 2022-11-27 DIAGNOSIS — T1592XA Foreign body on external eye, part unspecified, left eye, initial encounter: Secondary | ICD-10-CM | POA: Diagnosis not present

## 2022-12-28 DIAGNOSIS — H5789 Other specified disorders of eye and adnexa: Secondary | ICD-10-CM | POA: Diagnosis not present

## 2023-04-21 DIAGNOSIS — Z0000 Encounter for general adult medical examination without abnormal findings: Secondary | ICD-10-CM | POA: Diagnosis not present

## 2023-05-14 DIAGNOSIS — Z87891 Personal history of nicotine dependence: Secondary | ICD-10-CM | POA: Diagnosis not present
